# Patient Record
Sex: Female | Born: 1970 | Race: Black or African American | Hispanic: No | Marital: Single | Smoking: Never smoker
Health system: Southern US, Community
[De-identification: ages and names within clinical notes are randomized; demographics above are authoritative.]

## PROBLEM LIST (undated history)

## (undated) DIAGNOSIS — N879 Dysplasia of cervix uteri, unspecified: Secondary | ICD-10-CM

## (undated) DIAGNOSIS — R32 Unspecified urinary incontinence: Secondary | ICD-10-CM

## (undated) HISTORY — DX: Unspecified urinary incontinence: R32

## (undated) HISTORY — PX: FOOT SURGERY: SHX648

## (undated) HISTORY — DX: Dysplasia of cervix uteri, unspecified: N87.9

## (undated) HISTORY — PX: COLPOSCOPY: SHX161

## (undated) HISTORY — PX: TUBAL LIGATION: SHX77

## (undated) HISTORY — PX: LASER ABLATION OF THE CERVIX: SHX1949

---

## 2005-04-17 DIAGNOSIS — N879 Dysplasia of cervix uteri, unspecified: Secondary | ICD-10-CM

## 2005-04-17 HISTORY — DX: Dysplasia of cervix uteri, unspecified: N87.9

## 2009-01-15 ENCOUNTER — Emergency Department (HOSPITAL_COMMUNITY): Admission: EM | Admit: 2009-01-15 | Discharge: 2009-01-15 | Payer: Self-pay | Admitting: Emergency Medicine

## 2009-01-24 ENCOUNTER — Emergency Department (HOSPITAL_COMMUNITY): Admission: EM | Admit: 2009-01-24 | Discharge: 2009-01-24 | Payer: Self-pay | Admitting: Emergency Medicine

## 2010-10-12 ENCOUNTER — Inpatient Hospital Stay (INDEPENDENT_AMBULATORY_CARE_PROVIDER_SITE_OTHER)
Admission: RE | Admit: 2010-10-12 | Discharge: 2010-10-12 | Disposition: A | Discharge status: Home / Self Care | Payer: PRIVATE HEALTH INSURANCE | Source: Ambulatory Visit | Attending: Family Medicine | Admitting: Family Medicine

## 2010-10-12 DIAGNOSIS — J069 Acute upper respiratory infection, unspecified: Secondary | ICD-10-CM

## 2010-10-12 DIAGNOSIS — H6120 Impacted cerumen, unspecified ear: Secondary | ICD-10-CM

## 2010-11-15 ENCOUNTER — Ambulatory Visit (INDEPENDENT_AMBULATORY_CARE_PROVIDER_SITE_OTHER): Payer: PRIVATE HEALTH INSURANCE | Admitting: Gynecology

## 2010-11-15 ENCOUNTER — Encounter: Payer: Self-pay | Admitting: Gynecology

## 2010-11-15 ENCOUNTER — Other Ambulatory Visit (HOSPITAL_COMMUNITY)
Admission: RE | Admit: 2010-11-15 | Discharge: 2010-11-15 | Disposition: A | Discharge status: Home / Self Care | Payer: PRIVATE HEALTH INSURANCE | Source: Ambulatory Visit | Attending: Gynecology | Admitting: Gynecology

## 2010-11-15 VITALS — BP 110/70 | Ht 70.0 in | Wt 188.0 lb

## 2010-11-15 DIAGNOSIS — B3731 Acute candidiasis of vulva and vagina: Secondary | ICD-10-CM

## 2010-11-15 DIAGNOSIS — N920 Excessive and frequent menstruation with regular cycle: Secondary | ICD-10-CM

## 2010-11-15 DIAGNOSIS — Z8632 Personal history of gestational diabetes: Secondary | ICD-10-CM

## 2010-11-15 DIAGNOSIS — B373 Candidiasis of vulva and vagina: Secondary | ICD-10-CM

## 2010-11-15 DIAGNOSIS — Z01419 Encounter for gynecological examination (general) (routine) without abnormal findings: Secondary | ICD-10-CM

## 2010-11-15 DIAGNOSIS — N393 Stress incontinence (female) (male): Secondary | ICD-10-CM

## 2010-11-15 DIAGNOSIS — Z13220 Encounter for screening for lipoid disorders: Secondary | ICD-10-CM

## 2010-11-15 MED ORDER — FLUCONAZOLE 150 MG PO TABS: 150.0000 mg | ORAL_TABLET | Freq: Once | ORAL | Status: AC

## 2010-11-15 NOTE — Progress Notes (Signed)
Misty Bates 03-06-1971 [id number]   History:    40 y.o.  for annual exam.  Has not had an annual exam for 4 years. Patient notes periods are coming every 21 days fairly heavy although they're always been this way since the delivery of her last child 4 years ago. No bleeding in between her periods. She notes that to both nipples are dry and cracked and have always been this way even before having children she is seen multiple physicians and tried lots of different creams and it does not help her. There's never any bleeding or galactorrhea. She is also bothered by loss of urine with coughing and sneezing worse after the rest of her child. No leakage in between no UTI symptoms. 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  Past medical history,surgical history, family history and social history were all reviewed and documented in the EPIC chart. ROS:  Was performed and pertinent positives and negatives are included in the history.  Exam: chaperone present Filed Vitals:   11/15/10 1408  BP: 110/70   General appearance  Normal Skin grossly normal Head/Neck normal with no cervical or supraclavicular adenopathy thyroid normal Lungs  clear Cardiac RR, without RMG Abdominal  soft, nontender, without masses, guarding, rebound or organomegaly  Breasts  examined lying and sitting without masses, retractions, discharge or axillary adenopathy.  Both nipples are normal in appearance and without overt cracking nipple discharge bleeding or other abnormalities Pelvic  Ext/BUS/vagina  normal   Cervix  normal located deep left vaginal corner Pap done white discharge noted wet prep done  Uterus  anteverted, normal size, shape and contour, midline and mobile nontender   Adnexa  Without masses or tenderness  Anus and perineum  normal   Rectovaginal  normal sphincter tone without palpated masses or  tenderness.   Assessment/Plan:  40 y.o. female for annual exam . #1 Health maintenance self breast exams on a monthly basis discussed and urged had screening mammogram at age 48 recommended her to schedule one now she agrees to arrange.  Using tubal sterilization for contraception. Routine CBC urinalysis and lipid profile were ordered random glucose ordered do to history of gestational diabetes. #2 Menorrhagia patient's periods are every 21 days fairly heavy lasting 7 days have been this way for years. When check baseline TSH prolactin CBC in reference to this her exam is normal I offered sonohysterogram for baseline although again this is not a new finding. Options for management were reviewed to include this low dose oral contraceptives she does use snuff, short this is a good choice. Mirena IUD was also discussed although she did try this once before and had it removed because of side effects.  Endometrial ablation was also reviewed and I gave her a copy of her option pamphlet and information. She can read over this and decided she was to followup with [place]. If she doesn't we'll start with sonohysterogram. #3 Urinary incontinence. Patient has history suggestive of stress urinary incontinence. She loses urine with laughing sneezing coughing no real urgency symptoms and no spontaneous loss of. I discussed the options to include Kegel exercises we talked about these as well as considering a sling surgical procedure.  Patient is not interested surgery for now she can try the Kegel exercises and she is interested I will refer her to urology for evaluation. #4 patient has white discharge KOH wet  prep is positive for yeast we'll treat with Diflucan 150x1 dose follow up if symptoms persist or recur.    [name] MD, 3:33 PM 11/15/2010

## 2010-11-16 ENCOUNTER — Encounter: Payer: Self-pay | Admitting: Gynecology

## 2010-11-16 ENCOUNTER — Telehealth: Payer: Self-pay | Admitting: Gynecology

## 2010-11-16 NOTE — Telephone Encounter (Signed)
Wet prep positive for yeast

## 2011-11-21 ENCOUNTER — Encounter: Payer: PRIVATE HEALTH INSURANCE | Admitting: Gynecology

## 2011-11-29 ENCOUNTER — Telehealth: Payer: Self-pay | Admitting: *Deleted

## 2011-11-29 NOTE — Telephone Encounter (Signed)
Pt called c/o she couldn't find her tampon that was placed on Friday night. Pt said she went out party Friday night and doesn't remember taking her old one out before placing a new tampon. Pt has no fever, foul odor, pain. I explained to pt about toxic shock syndrome, offered OV to make sure tampon was out, pt declined stating "I will wait for a couple of day and call back if needed."

## 2011-11-30 ENCOUNTER — Encounter: Payer: Self-pay | Admitting: Gynecology

## 2011-11-30 ENCOUNTER — Ambulatory Visit (INDEPENDENT_AMBULATORY_CARE_PROVIDER_SITE_OTHER): Payer: PRIVATE HEALTH INSURANCE | Admitting: Gynecology

## 2011-11-30 ENCOUNTER — Encounter: Payer: PRIVATE HEALTH INSURANCE | Admitting: Gynecology

## 2011-11-30 ENCOUNTER — Other Ambulatory Visit (HOSPITAL_COMMUNITY)
Admission: RE | Admit: 2011-11-30 | Discharge: 2011-11-30 | Disposition: A | Discharge status: Home / Self Care | Payer: No Typology Code available for payment source | Source: Ambulatory Visit | Attending: Gynecology | Admitting: Gynecology

## 2011-11-30 VITALS — BP 120/70 | Ht 70.5 in | Wt 203.0 lb

## 2011-11-30 DIAGNOSIS — N920 Excessive and frequent menstruation with regular cycle: Secondary | ICD-10-CM

## 2011-11-30 DIAGNOSIS — Z01419 Encounter for gynecological examination (general) (routine) without abnormal findings: Secondary | ICD-10-CM

## 2011-11-30 DIAGNOSIS — Z131 Encounter for screening for diabetes mellitus: Secondary | ICD-10-CM

## 2011-11-30 DIAGNOSIS — Z1151 Encounter for screening for human papillomavirus (HPV): Secondary | ICD-10-CM | POA: Insufficient documentation

## 2011-11-30 DIAGNOSIS — Z13220 Encounter for screening for lipoid disorders: Secondary | ICD-10-CM

## 2011-11-30 DIAGNOSIS — N393 Stress incontinence (female) (male): Secondary | ICD-10-CM

## 2011-11-30 LAB — LIPID PANEL
Cholesterol: 194 mg/dL (ref 0–200)
LDL Cholesterol: 95 mg/dL (ref 0–99)
VLDL: 39 mg/dL (ref 0–40)

## 2011-11-30 LAB — CBC WITH DIFFERENTIAL/PLATELET
Basophils Absolute: 0 10*3/uL (ref 0.0–0.1)
Eosinophils Absolute: 0.1 10*3/uL (ref 0.0–0.7)
Lymphocytes Relative: 29 % (ref 12–46)
Lymphs Abs: 1.4 10*3/uL (ref 0.7–4.0)
MCH: 29.7 pg (ref 26.0–34.0)
MCHC: 34.8 g/dL (ref 30.0–36.0)
Monocytes Absolute: 0.5 10*3/uL (ref 0.1–1.0)
RDW: 14.8 % (ref 11.5–15.5)
WBC: 4.9 10*3/uL (ref 4.0–10.5)

## 2011-11-30 NOTE — Progress Notes (Signed)
Misty Bates February 27, 1971 [id number]        41 y.o.  [id number] for annual exam.  Several issues noted below.  Past medical history,surgical history, medications, allergies, family history and social history were all reviewed and documented in the EPIC chart. ROS:  Was performed and pertinent positives and negatives are included in the history.  Exam: Amy assistant Filed Vitals:   11/30/11 1500  BP: 120/70  Height: 5' 10.5" (1.791 m)  Weight: 203 lb (92.08 kg)   General appearance  Normal Skin grossly normal Head/Neck normal with no cervical or supraclavicular adenopathy thyroid normal Lungs  clear Cardiac RR, without RMG Abdominal  soft, nontender, without masses, organomegaly or hernia Breasts  examined lying and sitting without masses, retractions, discharge or axillary adenopathy. Pelvic  Ext/BUS/vagina  normal   Cervix  normal posterior right deep vaginal vault. Pap/HPV  Uterus  anteverted, normal size, shape and contour, midline and mobile nontender   Adnexa  Without masses or tenderness    Anus and perineum  normal   Rectovaginal  normal sphincter tone without palpated masses or tenderness.    Assessment/Plan:  41 y.o. [name] female for annual exam, status post BTL.   1. Menorrhagia. Patient's menses continue heavy. We discussed this last year. Her TSH was normal CBC shows mild anemia. Recommend start with sonohysterogram this year rule out nonpalpable abnormalities. I again reviewed options to include hormonal manipulation, Mirena IUD, endometrial ablation, hysterectomy. I can give her literature on the [employer] endometrial ablation. She'll follow for the sonohysterogram and then we'll go from there. 2. Urinary incontinence. I again discussed her classic SUI symptoms as per last year. I reviewed options to include Kegel exercise, observation, surgical repair such as sling.  Offered referral to urology and she declined and would prefer to observe at present. 3. Mammogram.  Patient has not had mammogram. I strongly urged her to do so and discussed the benefits of early detection. She agrees to schedule this I gave her names of facilities in [place]. SBE monthly reviewed. 4. Pap smear.  Pap smear last year was normal. I repeated Pap/HPV. 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 She reports her Pap smears have been normal since then. 5. Health maintenance. Baseline CBC lipid profile glucose urinalysis ordered. Patient will follow up for her ultrasound and we'll go from there.    Misty Bates, 4:11 PM 11/30/2011

## 2011-11-30 NOTE — Patient Instructions (Signed)
Follow up for ultrasound as scheduled 

## 2011-12-01 LAB — URINALYSIS W MICROSCOPIC + REFLEX CULTURE
Bacteria, UA: NONE SEEN
Crystals: NONE SEEN
Glucose, UA: NEGATIVE mg/dL
Hgb urine dipstick: NEGATIVE
Ketones, ur: NEGATIVE mg/dL
Leukocytes, UA: NEGATIVE
Protein, ur: NEGATIVE mg/dL
pH: 7 (ref 5.0–8.0)

## 2011-12-07 ENCOUNTER — Encounter: Payer: PRIVATE HEALTH INSURANCE | Admitting: Gynecology

## 2011-12-11 ENCOUNTER — Emergency Department (INDEPENDENT_AMBULATORY_CARE_PROVIDER_SITE_OTHER): Payer: PRIVATE HEALTH INSURANCE

## 2011-12-11 ENCOUNTER — Emergency Department (INDEPENDENT_AMBULATORY_CARE_PROVIDER_SITE_OTHER)
Admission: EM | Admit: 2011-12-11 | Discharge: 2011-12-11 | Disposition: A | Discharge status: Home / Self Care | Payer: PRIVATE HEALTH INSURANCE | Source: Home / Self Care | Attending: Emergency Medicine | Admitting: Emergency Medicine

## 2011-12-11 ENCOUNTER — Emergency Department (HOSPITAL_COMMUNITY)
Admission: EM | Admit: 2011-12-11 | Discharge: 2011-12-11 | Disposition: A | Discharge status: Home / Self Care | Payer: PRIVATE HEALTH INSURANCE | Source: Home / Self Care | Attending: Emergency Medicine | Admitting: Emergency Medicine

## 2011-12-11 ENCOUNTER — Encounter (HOSPITAL_COMMUNITY): Payer: Self-pay | Admitting: Emergency Medicine

## 2011-12-11 DIAGNOSIS — R109 Unspecified abdominal pain: Secondary | ICD-10-CM

## 2011-12-11 LAB — CBC WITH DIFFERENTIAL/PLATELET
Basophils Absolute: 0 10*3/uL (ref 0.0–0.1)
Eosinophils Absolute: 0.1 10*3/uL (ref 0.0–0.7)
Eosinophils Relative: 1 % (ref 0–5)
HCT: 38 % (ref 36.0–46.0)
Hemoglobin: 13.1 g/dL (ref 12.0–15.0)
Monocytes Absolute: 0.3 10*3/uL (ref 0.1–1.0)
Monocytes Relative: 6 % (ref 3–12)
Neutro Abs: 3.8 10*3/uL (ref 1.7–7.7)
Platelets: 271 10*3/uL (ref 150–400)
Smear Review: ADEQUATE

## 2011-12-11 LAB — POCT PREGNANCY, URINE: Preg Test, Ur: NEGATIVE

## 2011-12-11 LAB — POCT URINALYSIS DIP (DEVICE)
Bilirubin Urine: NEGATIVE
Glucose, UA: NEGATIVE mg/dL
Specific Gravity, Urine: 1.01 (ref 1.005–1.030)
Urobilinogen, UA: 0.2 mg/dL (ref 0.0–1.0)
pH: 6 (ref 5.0–8.0)

## 2011-12-11 MED ORDER — POLYETHYLENE GLYCOL 3350 17 GM/SCOOP PO POWD: 17.0000 g | Freq: Every day | ORAL | Status: AC

## 2011-12-11 MED ORDER — MELOXICAM 15 MG PO TABS: 15.0000 mg | ORAL_TABLET | Freq: Every day | ORAL | Status: AC

## 2011-12-11 NOTE — ED Notes (Signed)
State she has been having pain in her left flank area for past week

## 2011-12-11 NOTE — ED Provider Notes (Signed)
Chief Complaint  Patient presents with  . Flank Pain    History of Present Illness:    The patient is a 41 year old female who has had a one-week history of intermittent pain in the right and left flank. The pain seems to alternate between one flank any other. Since this morning she's had pain and left flank area which feels like a spasm or a pinching pain. It does not radiate. The pain is nonpleuritic. It hurts to touch, with movement, or T10 is better with Advil. She's felt nauseated and hasn't had much of an appetite. She has had no vomiting. Generally she's been constipated although today she has had some loose stools without blood or mucus. She notes slight dysuria and frequency, but no urgency or hematuria. Last menstrual period was August 8. She is sexually active. She denies any vaginal discharge or itching.  Review of Systems:  Other than noted above, the patient denies any of the following symptoms: Constitutional:  No fever, chills, fatigue, weight loss or anorexia. Lungs:  No cough or shortness of breath. Heart:  No chest pain, palpitations, syncope or edema.  No cardiac history. Abdomen:  No nausea, vomiting, hematememesis, melena, diarrhea, or hematochezia. GU:  No dysuria, frequency, urgency, or hematuria. Gyn:  No vaginal discharge, itching, abnormal bleeding, dyspareunia, or pelvic pain. Skin:  No rash or itching.  PMFSH:  Past medical history, family history, social history, meds, and allergies were reviewed along with nurse's notes.  No prior abdominal surgeries, past history of GI problems, STDs or GYN problems.  No history of aspirin or NSAID use.  No excessive alcohol intake.  Physical Exam:   Vital signs:  BP 115/75  Pulse 76  Temp 97.9 F (36.6 C) (Oral)  Resp 18  SpO2 98%  LMP 11/23/2011 Gen:  Alert, oriented, in no distress. Lungs:  Breath sounds clear and equal bilaterally.  No wheezes, rales or rhonchi. Heart:  Regular rhythm.  No gallops or murmers.     Abdomen:  Abdomen was soft and flat. There was mild tenderness to palpation in the left flank area. No guarding or rebound. No organomegaly or mass. Bowel sounds are normally active. Skin:  Clear, warm and dry.  No rash.  Labs:   Results for orders placed during the hospital encounter of 12/11/11  CBC WITH DIFFERENTIAL      Component Value Range   WBC 5.8  4.0 - 10.5 K/uL   RBC 4.37  3.87 - 5.11 MIL/uL   Hemoglobin 13.1  12.0 - 15.0 g/dL   HCT 16.1  09.6 - 04.5 %   MCV 87.0  78.0 - 100.0 fL   MCH 30.0  26.0 - 34.0 pg   MCHC 34.5  30.0 - 36.0 g/dL   RDW 40.9  81.1 - 91.4 %   Platelets 271  150 - 400 K/uL   Neutrophils Relative 65  43 - 77 %   Lymphocytes Relative 28  12 - 46 %   Monocytes Relative 6  3 - 12 %   Eosinophils Relative 1  0 - 5 %   Basophils Relative 0  0 - 1 %   Neutro Abs 3.8  1.7 - 7.7 K/uL   Lymphs Abs 1.6  0.7 - 4.0 K/uL   Monocytes Absolute 0.3  0.1 - 1.0 K/uL   Eosinophils Absolute 0.1  0.0 - 0.7 K/uL   Basophils Absolute 0.0  0.0 - 0.1 K/uL   Smear Review       Value:  PLATELET CLUMPS NOTED ON SMEAR, COUNT APPEARS ADEQUATE  POCT PREGNANCY, URINE      Component Value Range   Preg Test, Ur NEGATIVE  NEGATIVE  POCT URINALYSIS DIP (DEVICE)      Component Value Range   Glucose, UA NEGATIVE  NEGATIVE mg/dL   Bilirubin Urine NEGATIVE  NEGATIVE   Ketones, ur NEGATIVE  NEGATIVE mg/dL   Specific Gravity, Urine 1.010  1.005 - 1.030   Hgb urine dipstick TRACE (*) NEGATIVE   pH 6.0  5.0 - 8.0   Protein, ur NEGATIVE  NEGATIVE mg/dL   Urobilinogen, UA 0.2  0.0 - 1.0 mg/dL   Nitrite NEGATIVE  NEGATIVE   Leukocytes, UA NEGATIVE  NEGATIVE     Radiology:  Dg Abd Acute W/chest  12/11/2011  *RADIOLOGY REPORT*  Clinical Data: 41 year old female left flank pain.  ACUTE ABDOMEN SERIES (ABDOMEN 2 VIEW & CHEST 1 VIEW)  Comparison: None.  Findings: Low lung volumes.  Mild crowding of markings at the lung bases.  Otherwise the lungs are clear.  No pneumothorax or  pneumoperitoneum.  Cardiac size and mediastinal contours are within normal limits.  Visualized tracheal air column is within normal limits.  Nonobstructed bowel gas pattern.  Pelvic phleboliths.  Abdominal and pelvic visceral contours are within normal limits.  No acute osseous abnormality identified.  IMPRESSION: 1. Nonobstructed bowel gas pattern, no free air. 2.  Low lung volumes.   Original Report Authenticated By: Harley Hallmark, M.D.     Assessment:  The encounter diagnosis was Left flank pain. This could be musculoskeletal or due to constipation. She did have a large amount of stool in her colon. There is no evidence for diverticulitis.  Plan:   1.  The following meds were prescribed:   New Prescriptions   MELOXICAM (MOBIC) 15 MG TABLET    Take 1 tablet (15 mg total) by mouth daily.   POLYETHYLENE GLYCOL POWDER (MIRALAX) POWDER    Take 17 g by mouth daily.   2.  The patient was instructed in symptomatic care and handouts were given. 3.  The patient was told to return if becoming worse in any way, if no better in 3 or 4 days, and given some red flag symptoms that would indicate earlier return. She was told to return if she should have any other symptoms such as fever, chills, nausea, vomiting, or blood in the stool.    Reuben Likes, MD 12/11/11 [phone]

## 2011-12-13 ENCOUNTER — Other Ambulatory Visit: Payer: Self-pay | Admitting: Gynecology

## 2011-12-13 DIAGNOSIS — N920 Excessive and frequent menstruation with regular cycle: Secondary | ICD-10-CM

## 2011-12-14 ENCOUNTER — Ambulatory Visit: Payer: PRIVATE HEALTH INSURANCE | Admitting: Gynecology

## 2011-12-14 ENCOUNTER — Other Ambulatory Visit: Payer: PRIVATE HEALTH INSURANCE

## 2012-02-28 ENCOUNTER — Ambulatory Visit (INDEPENDENT_AMBULATORY_CARE_PROVIDER_SITE_OTHER): Payer: PRIVATE HEALTH INSURANCE | Admitting: Gynecology

## 2012-02-28 ENCOUNTER — Encounter: Payer: Self-pay | Admitting: Gynecology

## 2012-02-28 DIAGNOSIS — L293 Anogenital pruritus, unspecified: Secondary | ICD-10-CM

## 2012-02-28 DIAGNOSIS — M545 Low back pain, unspecified: Secondary | ICD-10-CM

## 2012-02-28 DIAGNOSIS — N949 Unspecified condition associated with female genital organs and menstrual cycle: Secondary | ICD-10-CM

## 2012-02-28 DIAGNOSIS — R102 Pelvic and perineal pain: Secondary | ICD-10-CM

## 2012-02-28 DIAGNOSIS — N898 Other specified noninflammatory disorders of vagina: Secondary | ICD-10-CM

## 2012-02-28 DIAGNOSIS — N920 Excessive and frequent menstruation with regular cycle: Secondary | ICD-10-CM

## 2012-02-28 LAB — WET PREP FOR TRICH, YEAST, CLUE
Trich, Wet Prep: NONE SEEN
Yeast Wet Prep HPF POC: NONE SEEN

## 2012-02-28 LAB — URINALYSIS W MICROSCOPIC + REFLEX CULTURE
Leukocytes, UA: NEGATIVE
Protein, ur: NEGATIVE mg/dL
pH: 5.5 (ref 5.0–8.0)

## 2012-02-28 MED ORDER — METRONIDAZOLE 500 MG PO TABS: 500.0000 mg | ORAL_TABLET | Freq: Two times a day (BID) | ORAL | Status: DC

## 2012-02-28 NOTE — Patient Instructions (Signed)
Take Flagyl antibiotic twice daily for 7 days. No alcohol while taking the medication. Follow up for the ultrasound for your heavy periods as you scheduled.

## 2012-02-28 NOTE — Progress Notes (Signed)
Patient presents with several issues. 1. Vaginal discharge with some itching over the last week or so. No odor. 2. Dark urine with odor.  Some pelvic pressure with low back pain. No frequency dysuria urgency fever chills nausea vomiting. Does have constipation chronically with last bowel movement yesterday. No diarrhea. 3. Menorrhagia. Menses continue heavy as they have over the past several years. Flows 7 days double protection occasional breakthrough episodes. Have been asked to schedule ultrasound and never did so. Had normal CBC and thyroid.  Exam with [facility] [place] assistant Spine straight no CVA tenderness Abdomen soft nontender without masses guarding rebound organomegaly. Pelvic external BUS vagina with white discharge. Cervix normal. Uterus anteverted grossly normal in size midline mobile nontender. Adnexa without masses or tenderness.  Assessment and plan: 1. Vaginal discharge. Wet prep consistent with extra vaginosis. Treat with Flagyl 500 mg twice a day x7 days, alcohol voidance reviewed. Follow up if symptoms persist or recur. 2. Dark urine/pelvic discomfort. UA is negative. Exam is normal.  Start with ultrasound to rule out nonpalpable abnormalities such as ovarian cysts. Suspect discomfort may be related to her constipation. Recommended increased fiber, fluids to increase stooling. 3. Menorrhagia. I again recommended sonohysterogram rule out nonpalpable abnormality such as leiomyoma, submucous components, polyps.  Options for bleeding to include hormonal manipulation, endometrial ablation, Mirena IUD, hysterectomy discussed. She apparently had a Mirena IUD at another office previously but bled through this and had it removed. Patient agrees to schedule the ultrasound and will follow up for this.

## 2012-03-20 ENCOUNTER — Ambulatory Visit: Payer: PRIVATE HEALTH INSURANCE | Admitting: Gynecology

## 2012-03-20 ENCOUNTER — Other Ambulatory Visit: Payer: PRIVATE HEALTH INSURANCE

## 2013-04-04 ENCOUNTER — Ambulatory Visit (INDEPENDENT_AMBULATORY_CARE_PROVIDER_SITE_OTHER): Payer: 59 | Admitting: Gynecology

## 2013-04-04 ENCOUNTER — Encounter: Payer: Self-pay | Admitting: Gynecology

## 2013-04-04 ENCOUNTER — Other Ambulatory Visit: Payer: Self-pay | Admitting: Gynecology

## 2013-04-04 ENCOUNTER — Telehealth: Payer: Self-pay | Admitting: *Deleted

## 2013-04-04 DIAGNOSIS — N644 Mastodynia: Secondary | ICD-10-CM

## 2013-04-04 DIAGNOSIS — Z0000 Encounter for general adult medical examination without abnormal findings: Secondary | ICD-10-CM

## 2013-04-04 NOTE — Telephone Encounter (Signed)
Order placed for the below, breast center will contact pt to schedule.

## 2013-04-04 NOTE — Telephone Encounter (Signed)
Message copied by Aura Camps on Fri Apr 04, 2013  3:51 PM ------      Message from: [name]      Created: Fri Apr 04, 2013  3:11 PM       Schedule diagnostic mammography and ultrasound at radiologist discretion reference left breast pain no findings on physical exam. ------

## 2013-04-04 NOTE — Progress Notes (Signed)
Patient presents with several day history of left breast mastalgia. There is a diffuse tenderness in her left breast. No nipple discharge or masses on self breast exam. No history of same before. No symptoms in the right breast.  Exam was Misty Bates Both breasts examined lying and sitting without masses retractions discharge adenopathy.   Assessment and plan: New onset left breast pain with negative exam. No recent mammography. Recommend diagnostic mammogram possible ultrasound at radiology discretion. Recommend heat over the area. Will also check baseline prolactin. If mammogram/ultrasound negative and symptoms resolved and will follow. Otherwise patient will followup for further evaluation.

## 2013-04-04 NOTE — Patient Instructions (Signed)
Followup for mammogram and possible ultrasound as arranged by office.

## 2013-04-21 NOTE — Telephone Encounter (Signed)
Spoke with Casey at breast center regarding scheduling the below, they will be contacting patient to schedule.  

## 2013-04-22 NOTE — Telephone Encounter (Signed)
Appt. 04/28/13 @ 3:30 pm

## 2013-04-28 ENCOUNTER — Ambulatory Visit
Admission: RE | Admit: 2013-04-28 | Discharge: 2013-04-28 | Disposition: A | Discharge status: Home / Self Care | Payer: 59 | Source: Ambulatory Visit | Attending: Gynecology | Admitting: Gynecology

## 2013-04-28 DIAGNOSIS — N644 Mastodynia: Secondary | ICD-10-CM

## 2013-05-15 ENCOUNTER — Ambulatory Visit (INDEPENDENT_AMBULATORY_CARE_PROVIDER_SITE_OTHER): Payer: 59 | Admitting: Gynecology

## 2013-05-15 ENCOUNTER — Encounter: Payer: Self-pay | Admitting: Gynecology

## 2013-05-15 VITALS — BP 124/78 | Ht 70.0 in | Wt 208.0 lb

## 2013-05-15 DIAGNOSIS — N920 Excessive and frequent menstruation with regular cycle: Secondary | ICD-10-CM

## 2013-05-15 DIAGNOSIS — Z01419 Encounter for gynecological examination (general) (routine) without abnormal findings: Secondary | ICD-10-CM

## 2013-05-15 DIAGNOSIS — N393 Stress incontinence (female) (male): Secondary | ICD-10-CM

## 2013-05-15 NOTE — Progress Notes (Signed)
Misty DittoShannon Bates 05-31-1970 [id number]        43 y.o.  [id number] for annual exam.  Several issues noted below.  Past medical history,surgical history, problem list, medications, allergies, family history and social history were all reviewed and documented in the EPIC chart.  ROS:  Performed and pertinent positives and negatives are included in the history, assessment and plan .  Exam: Kim assistant Filed Vitals:   05/15/13 1508  BP: 124/78  Height: 5\' 10"  (1.778 m)  Weight: 208 lb (94.348 kg)   General appearance  Normal Skin grossly normal Head/Neck normal with no cervical or supraclavicular adenopathy thyroid normal Lungs  clear Cardiac RR, without RMG Abdominal  soft, nontender, without masses, organomegaly or hernia Breasts  examined lying and sitting without masses, retractions, discharge or axillary adenopathy. Pelvic  Ext/BUS/vagina  Normal  Cervix  Normal high in the vaginal vault to the left of the patient  Uterus  axial to anteverted, normal size, shape and contour, midline and mobile nontender   Adnexa  Without masses or tenderness    Anus and perineum  Normal   Rectovaginal  Normal sphincter tone without palpated masses or tenderness.    Assessment/Plan:  43 y.o. [id number] female for annual exam heavy regular menses, tubal sterilization.   1. Menorrhagia. Patient's menses remain heavy. Regular monthly with no intermenstrual bleeding. I reviewed several times last year situation and options per 02/28/2012 and 11/30/2011 Notes. Had recommended sonohysterogram which she never followed up for. I again reviewed possibilities to include physiologic/adenomyosis/small myomas/endometrial polyps. I again recommended sonohysterogram. Patient at this point declined and said that she will watch them for now. Discussed checking TSH CBC. Patient states that she thought she had the blood drawn previously when she had her prolactin. I see future orders for her lab work of note lab results.  Patient's adamant about having had it done and declined any blood work at this time. We'll go ahead and check to make sure that from the records it does not appear that this was drawn. 2. Mastalgia. Recent evaluation for mastalgia showed negative diagnostic mammography and a normal exam. Patient notes that her discomfort is getting better. She'll continue with SBE monthly and annual mammography is as long as her pain resolves and we'll follow. If she has any palpable abnormalities or focal persistent pain she'll represent for evaluation. 3. Pap smear/HPV normal 11/2011. No Pap smear done today. 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 Plan repeat Pap smear at 3-5 year interval. 4. Urinary incontinence, stress by history with laughing coughing sneezing. I have discussed this on multiple occasions to include behavior modification/Kegel exercises. Recommended urologic evaluation to consider surgery or physical therapy. Patient agrees to followup with this will help her make this arrangement. Check urinalysis today. 5. Health maintenance. I again encouraged her to have CBC comprehensive metabolic panel lipid profile TSH drawn but she declined stating she thought she had trauma to her prolactin which again I do not think so but will check to make sure for her. If not and she knows my recommendation to have this drawn.   Note: This document was prepared with digital dictation and possible smart phrase technology. Any transcriptional errors that result from this process are unintentional.   Dara LordsFONTAINE,[name] MD, 3:33 PM 05/15/2013

## 2013-05-15 NOTE — Patient Instructions (Signed)
Office will contact you to arrange a urology appointment.

## 2013-05-16 ENCOUNTER — Telehealth: Payer: Self-pay | Admitting: *Deleted

## 2013-05-16 LAB — URINALYSIS W MICROSCOPIC + REFLEX CULTURE
Bacteria, UA: NONE SEEN
Bilirubin Urine: NEGATIVE
Casts: NONE SEEN
Crystals: NONE SEEN
Glucose, UA: NEGATIVE mg/dL
HGB URINE DIPSTICK: NEGATIVE
Ketones, ur: NEGATIVE mg/dL
NITRITE: NEGATIVE
Protein, ur: NEGATIVE mg/dL
Specific Gravity, Urine: 1.008 (ref 1.005–1.030)
Urobilinogen, UA: 0.2 mg/dL (ref 0.0–1.0)
pH: 7 (ref 5.0–8.0)

## 2013-05-16 NOTE — Telephone Encounter (Signed)
Appt. 05/26/13 @ 8:45 am With Dr.MacDiarmid pt informed, pt will come back to have blood done. Pt declined to have ultrasound.

## 2013-05-16 NOTE — Telephone Encounter (Signed)
Message copied by Aura CampsWEBB, JENNIFER L on Fri May 16, 2013 10:26 AM ------      Message from: Dara LordsFONTAINE, TIMOTHY P      Created: Thu May 15, 2013  3:59 PM       #1 patient needs appointment with urology reference stress urinary incontinence symptoms      #2 patient needs CBC comprehensive metabolic panel lipid profile TSH done. She swears she had it done when she had her prolactin in December. I see future order for all these but no results. If you could check with the lab just to verify they were not done until the patient and recommended she has these drawn      #3 patient and I discussed possible sonohysterogram but she declined at that time. Ask her if she wants to schedule the ultrasound reference her heavy bleeding ------

## 2013-05-17 LAB — URINE CULTURE

## 2013-07-04 ENCOUNTER — Emergency Department (INDEPENDENT_AMBULATORY_CARE_PROVIDER_SITE_OTHER)
Admission: EM | Admit: 2013-07-04 | Discharge: 2013-07-04 | Disposition: A | Discharge status: Home / Self Care | Payer: 59 | Source: Home / Self Care | Attending: Family Medicine | Admitting: Family Medicine

## 2013-07-04 ENCOUNTER — Encounter (HOSPITAL_COMMUNITY): Payer: Self-pay | Admitting: Emergency Medicine

## 2013-07-04 DIAGNOSIS — J069 Acute upper respiratory infection, unspecified: Secondary | ICD-10-CM

## 2013-07-04 MED ORDER — HYDROCOD POLST-CHLORPHEN POLST 10-8 MG/5ML PO LQCR: 5.0000 mL | Freq: Two times a day (BID) | ORAL | Status: DC | PRN

## 2013-07-04 MED ORDER — IPRATROPIUM BROMIDE 0.06 % NA SOLN: 2.0000 | Freq: Four times a day (QID) | NASAL | Status: DC

## 2013-07-04 MED ORDER — MINOCYCLINE HCL 100 MG PO CAPS: 100.0000 mg | ORAL_CAPSULE | Freq: Two times a day (BID) | ORAL | Status: DC

## 2013-07-04 NOTE — Discharge Instructions (Signed)
Drink plenty of fluids as discussed, use medicine as prescribed, and mucinex or delsym for cough. Return or see your doctor if further problems °

## 2013-07-04 NOTE — ED Provider Notes (Signed)
CSN: [id number]     Arrival date & time 07/04/13  0802 History   First MD Initiated Contact with Patient 07/04/13 0825     Chief Complaint  Patient presents with  . Sore Throat   (Consider location/radiation/quality/duration/timing/severity/associated sxs/prior Treatment) Patient is a 43 y.o. female presenting with pharyngitis. The history is provided by the patient.  Sore Throat This is a new problem. The current episode started more than 1 week ago (2 weeks of sx, laryngitis x 2 d.). The problem has been gradually worsening. Pertinent negatives include no chest pain, no abdominal pain, no headaches and no shortness of breath.    Past Medical History  Diagnosis Date  . Cervical dysplasia 2007    laser  . Urinary incontinence     Stress incontinence   Past Surgical History  Procedure Laterality Date  . Tubal ligation    . Laser ablation of the cervix    . Colposcopy    . Foot surgery     Family History  Problem Relation Age of Onset  . Diabetes Maternal Aunt   . Hypertension Maternal Aunt   . Diabetes Paternal Grandmother   . Hypertension Paternal Grandmother    History  Substance Use Topics  . Smoking status: Never Smoker   . Smokeless tobacco: Current User    Types: Snuff     Comment: trying to quit  . Alcohol Use: Yes     Comment: occassionally   OB History   Grav Para Term Preterm Abortions TAB SAB Ect Mult Living   5 4 4  1  1   4      Review of Systems  Constitutional: Negative.   HENT: Positive for congestion, postnasal drip, rhinorrhea and voice change.   Respiratory: Negative for shortness of breath and wheezing.   Cardiovascular: Negative for chest pain.  Gastrointestinal: Negative for abdominal pain.  Neurological: Negative for headaches.    Allergies  Review of patient's allergies indicates no known allergies.  Home Medications   Current Outpatient Rx  Name  Route  Sig  Dispense  Refill  . chlorpheniramine-HYDROcodone (TUSSIONEX PENNKINETIC  ER) 10-8 MG/5ML LQCR   Oral   Take 5 mLs by mouth every 12 (twelve) hours as needed for cough.   115 mL   0   . ipratropium (ATROVENT) 0.06 % nasal spray   Each Nare   Place 2 sprays into both nostrils 4 (four) times daily.   15 mL   1   . minocycline (MINOCIN,DYNACIN) 100 MG capsule   Oral   Take 1 capsule (100 mg total) by mouth 2 (two) times daily.   14 capsule   0    BP 117/75  Pulse 72  Temp(Src) 98.6 F (37 C) (Oral)  Resp 14  SpO2 100%  LMP 06/27/2013 Physical Exam  Nursing note and vitals reviewed. Constitutional: She is oriented to person, place, and time. She appears well-developed and well-nourished.  HENT:  Head: Normocephalic.  Right Ear: External ear normal.  Left Ear: External ear normal.  Nose: Mucosal edema and rhinorrhea present.  Mouth/Throat: Oropharynx is clear and moist.  Eyes: Conjunctivae are normal. Pupils are equal, round, and reactive to light.  Neck: Normal range of motion. Neck supple.  Cardiovascular: Normal rate and normal heart sounds.   Pulmonary/Chest: Effort normal and breath sounds normal.  Lymphadenopathy:    She has no cervical adenopathy.  Neurological: She is alert and oriented to person, place, and time.  Skin: Skin is warm and dry.  ED Course  Procedures (including critical care time) Labs Review Labs Reviewed - No data to display Imaging Review No results found.   MDM   1. URI (upper respiratory infection)        Linna HoffJames D Vinal Rosengrant, MD 07/04/13 [phone]

## 2013-07-04 NOTE — ED Notes (Signed)
Pt  Reports  Symptoms  Of  sorethroat           Earache    Bilateral     As  Well  As  Sinus  Drainage           With  Symptoms  X  2  Weeks                 symptoms     Not  releived  By  otc  meds         She reports    Hoarseness        As  Well

## 2013-11-12 ENCOUNTER — Encounter: Payer: Self-pay | Admitting: Gynecology

## 2013-11-12 ENCOUNTER — Ambulatory Visit (INDEPENDENT_AMBULATORY_CARE_PROVIDER_SITE_OTHER): Payer: 59 | Admitting: Gynecology

## 2013-11-12 DIAGNOSIS — B373 Candidiasis of vulva and vagina: Secondary | ICD-10-CM

## 2013-11-12 DIAGNOSIS — N898 Other specified noninflammatory disorders of vagina: Secondary | ICD-10-CM

## 2013-11-12 DIAGNOSIS — B3731 Acute candidiasis of vulva and vagina: Secondary | ICD-10-CM

## 2013-11-12 LAB — WET PREP FOR TRICH, YEAST, CLUE
Clue Cells Wet Prep HPF POC: NONE SEEN
TRICH WET PREP: NONE SEEN

## 2013-11-12 MED ORDER — FLUCONAZOLE 150 MG PO TABS: 150.0000 mg | ORAL_TABLET | Freq: Once | ORAL | Status: DC

## 2013-11-12 NOTE — Patient Instructions (Signed)
Take Diflucan pill. Followup if your symptoms persist, worsen or recur.  Monilial Vaginitis Vaginitis in a soreness, swelling and redness (inflammation) of the vagina and vulva. Monilial vaginitis is not a sexually transmitted infection. CAUSES  Yeast vaginitis is caused by yeast (candida) that is normally found in your vagina. With a yeast infection, the candida has overgrown in number to a point that upsets the chemical balance. SYMPTOMS   White, thick vaginal discharge.  Swelling, itching, redness and irritation of the vagina and possibly the lips of the vagina (vulva).  Burning or painful urination.  Painful intercourse. DIAGNOSIS  Things that may contribute to monilial vaginitis are:  Postmenopausal and virginal states.  Pregnancy.  Infections.  Being tired, sick or stressed, especially if you had monilial vaginitis in the past.  Diabetes. Good control will help lower the chance.  Birth control pills.  Tight fitting garments.  Using bubble bath, feminine sprays, douches or deodorant tampons.  Taking certain medications that kill germs (antibiotics).  Sporadic recurrence can occur if you become ill. TREATMENT  Your caregiver will give you medication.  There are several kinds of anti monilial vaginal creams and suppositories specific for monilial vaginitis. For recurrent yeast infections, use a suppository or cream in the vagina 2 times a week, or as directed.  Anti-monilial or steroid cream for the itching or irritation of the vulva may also be used. Get your caregiver's permission.  Painting the vagina with methylene blue solution may help if the monilial cream does not work.  Eating yogurt may help prevent monilial vaginitis. HOME CARE INSTRUCTIONS   Finish all medication as prescribed.  Do not have sex until treatment is completed or after your caregiver tells you it is okay.  Take warm sitz baths.  Do not douche.  Do not use tampons, especially scented  ones.  Wear cotton underwear.  Avoid tight pants and panty hose.  Tell your sexual partner that you have a yeast infection. They should go to their caregiver if they have symptoms such as mild rash or itching.  Your sexual partner should be treated as well if your infection is difficult to eliminate.  Practice safer sex. Use condoms.  Some vaginal medications cause latex condoms to fail. Vaginal medications that harm condoms are:  Cleocin cream.  Butoconazole (Femstat).  Terconazole (Terazol) vaginal suppository.  Miconazole (Monistat) (may be purchased over the counter). SEEK MEDICAL CARE IF:   You have a temperature by mouth above 102 F (38.9 C).  The infection is getting worse after 2 days of treatment.  The infection is not getting better after 3 days of treatment.  You develop blisters in or around your vagina.  You develop vaginal bleeding, and it is not your menstrual period.  You have pain when you urinate.  You develop intestinal problems.  You have pain with sexual intercourse. Document Released: 01/11/2005 Document Revised: 06/26/2011 Document Reviewed: 09/25/2008 [facility] Patient Information 2015 [employer], [place]. This information is not intended to replace advice given to you by your health care provider. Make sure you discuss any questions you have with your health care provider.

## 2013-11-12 NOTE — Progress Notes (Signed)
Minda DittoShannon Hardigree 1970-08-27 [id number]        43 y.o.  [id number] presents complaining of vaginal discharge x1 day. Slight irritation. No real odor. No urinary symptoms such as frequency dysuria or urgency.  Past medical history,surgical history, problem list, medications, allergies, family history and social history were all reviewed and documented in the EPIC chart.  Directed ROS with pertinent positives and negatives documented in the history of present illness/assessment and plan.  Exam: Kim assistant General appearance:  Normal Pelvic external BUS vagina with white discharge. Cervix high in the vaginal vault. Uterus normal size midline mobile nontender. Adnexa without masses or tenderness  Assessment/Plan:  43 y.o. [id number] with exam, symptoms and wet prep consistent with yeast vaginitis. Will treat with Diflucan 150 mg x1 dose. Followup if symptoms persist, worsen or recur.   Note: This document was prepared with digital dictation and possible smart phrase technology. Any transcriptional errors that result from this process are unintentional.   Dara LordsFONTAINE,Estrellita Lasky P MD, 2:52 PM 11/12/2013

## 2014-02-16 ENCOUNTER — Encounter: Payer: Self-pay | Admitting: Gynecology

## 2014-02-26 ENCOUNTER — Encounter (HOSPITAL_COMMUNITY): Payer: Self-pay

## 2014-02-26 ENCOUNTER — Emergency Department (HOSPITAL_COMMUNITY)
Admission: EM | Admit: 2014-02-26 | Discharge: 2014-02-26 | Disposition: A | Discharge status: Home / Self Care | Payer: 59 | Attending: Emergency Medicine | Admitting: Emergency Medicine

## 2014-02-26 DIAGNOSIS — Z8742 Personal history of other diseases of the female genital tract: Secondary | ICD-10-CM | POA: Diagnosis not present

## 2014-02-26 DIAGNOSIS — S61411A Laceration without foreign body of right hand, initial encounter: Secondary | ICD-10-CM | POA: Diagnosis present

## 2014-02-26 DIAGNOSIS — Y998 Other external cause status: Secondary | ICD-10-CM | POA: Insufficient documentation

## 2014-02-26 DIAGNOSIS — Y929 Unspecified place or not applicable: Secondary | ICD-10-CM | POA: Insufficient documentation

## 2014-02-26 DIAGNOSIS — Z23 Encounter for immunization: Secondary | ICD-10-CM | POA: Insufficient documentation

## 2014-02-26 DIAGNOSIS — W260XXA Contact with knife, initial encounter: Secondary | ICD-10-CM | POA: Insufficient documentation

## 2014-02-26 DIAGNOSIS — Y9389 Activity, other specified: Secondary | ICD-10-CM | POA: Insufficient documentation

## 2014-02-26 DIAGNOSIS — Z79899 Other long term (current) drug therapy: Secondary | ICD-10-CM | POA: Diagnosis not present

## 2014-02-26 MED ORDER — TETANUS-DIPHTH-ACELL PERTUSSIS 5-2.5-18.5 LF-MCG/0.5 IM SUSP
0.5000 mL | Freq: Once | INTRAMUSCULAR | Status: AC
  Administered 2014-02-26: 0.5 mL via INTRAMUSCULAR
  Filled 2014-02-26: qty 0.5

## 2014-02-26 NOTE — ED Notes (Signed)
PA at the bedside.

## 2014-02-26 NOTE — ED Provider Notes (Signed)
CSN: [id number]     Arrival date & time 02/26/14  [id number] History   First MD Initiated Contact with Patient 02/26/14 [phone]     Chief Complaint  Patient presents with  . Laceration     (Consider location/radiation/quality/duration/timing/severity/associated sxs/prior Treatment) HPI Pt is a 43yo female presenting to ED with laceration to the palm of her right hand after dinner last night.  Pt states she was putting a set of knifes away in a closet when it slipped and fell.  Pt states she controlled the bleeding with direct pressure. Denies pain unless the skin gets hooked on something.  No pain medication PTA. No other injuries. Unsure her last tetanus.  Pt states she is afraid of needles and would prefer wound glue if possible.   Past Medical History  Diagnosis Date  . Cervical dysplasia 2007    laser  . Urinary incontinence     Stress incontinence   Past Surgical History  Procedure Laterality Date  . Tubal ligation    . Laser ablation of the cervix    . Colposcopy    . Foot surgery     Family History  Problem Relation Age of Onset  . Diabetes Maternal Aunt   . Hypertension Maternal Aunt   . Diabetes Paternal Grandmother   . Hypertension Paternal Grandmother    History  Substance Use Topics  . Smoking status: Never Smoker   . Smokeless tobacco: Current User    Types: Snuff     Comment: trying to quit  . Alcohol Use: Yes     Comment: occassionally   OB History    Gravida Para Term Preterm AB TAB SAB Ectopic Multiple Living   5 4 4  1  1   4      Review of Systems  Musculoskeletal: Negative for myalgias and joint swelling.  Skin: Positive for wound ( palm of right hand).  All other systems reviewed and are negative.     Allergies  Review of patient's allergies indicates no known allergies.  Home Medications   Prior to Admission medications   Medication Sig Start Date End Date Taking? Authorizing Provider  fluconazole (DIFLUCAN) 150 MG tablet Take 1 tablet (150  mg total) by mouth once. 11/12/13   Dara Lordsimothy P Fontaine, MD  ipratropium (ATROVENT) 0.06 % nasal spray Place 2 sprays into both nostrils 4 (four) times daily. 07/04/13   Linna HoffJames D Kindl, MD  Multiple Vitamin (MULTIVITAMIN) tablet Take 1 tablet by mouth daily.    Historical Provider, MD   BP 116/75 mmHg  Pulse 100  Temp(Src) 98.5 F (36.9 C) (Oral)  Resp 20  Ht 5\' 10"  (1.778 m)  SpO2 100%  LMP 02/26/2014 Physical Exam  Constitutional: She is oriented to person, place, and time. She appears well-developed and well-nourished.  HENT:  Head: Normocephalic and atraumatic.  Eyes: EOM are normal.  Neck: Normal range of motion.  Cardiovascular: Normal rate.   Right hand: cap refill <3 seconds  Pulmonary/Chest: Effort normal.  Musculoskeletal: Normal range of motion.       Hands: Neurological: She is alert and oriented to person, place, and time.  Skin: Skin is warm and dry.  1.5cm laceration to palm aspect right hand. No active bleeding. No tendon involvement. Small skin flap on proximal aspect of laceration. Minimal edema.   Psychiatric: She has a normal mood and affect. Her behavior is normal.  Nursing note and vitals reviewed.   ED Course  Procedures   LACERATION REPAIR Performed by:  O'MALLEY, Aleta Manternach A. Authorized by: Ina Homes'MALLEY, Kaleb Linquist A. Consent: Verbal consent obtained. Risks and benefits: risks, benefits and alternatives were discussed Consent given by: patient Patient identity confirmed: provided demographic data Prepped and Draped in normal sterile fashion Wound explored  Laceration Location: palm of right hand  Laceration Length: 1.5cm  No Foreign Bodies seen or palpated  Anesthesia: none  Irrigation method: syringe Amount of cleaning: standard  Skin closure: simple, close, adhesive strip and wound glue  Number of adhesive strips: 3  Technique: adhesive strip, wound glue and bandage   Patient tolerance: Patient tolerated the procedure well with no immediate  complications.   Labs Review Labs Reviewed - No data to display  Imaging Review No results found.   EKG Interpretation None      MDM   Final diagnoses:  Hand laceration, right, initial encounter    Pt presenting to ED with 1.5cm laceration to right hand that occurred last night "after dinner."  Tetanus unknown, Tdap given in ED.  Pt requested no sutures be placed as she is terrified of needles.  Adhesive strips and wound glue used then bandaged guaze placed over hand to protect wound. Advised pt to keep hand bandaged when at work to help protect wound as it is on palmer aspect of her hand. Home care instructions provided. Advised to f/u in 1 week for wound check if not improving. Return precautions provided. Pt verbalized understanding and agreement with tx plan.     Junius Finnerrin O'Malley, PA-C 02/26/14 0901  Flint MelterElliott L Wentz, MD 02/26/14 [phone]

## 2014-02-26 NOTE — ED Notes (Signed)
Pt here for laceration to right palm. Denies any pain unless it gets hooked on something.

## 2014-07-29 ENCOUNTER — Emergency Department (INDEPENDENT_AMBULATORY_CARE_PROVIDER_SITE_OTHER)
Admission: EM | Admit: 2014-07-29 | Discharge: 2014-07-29 | Disposition: A | Discharge status: Home / Self Care | Payer: 59 | Source: Home / Self Care | Attending: Emergency Medicine | Admitting: Emergency Medicine

## 2014-07-29 ENCOUNTER — Encounter (HOSPITAL_COMMUNITY): Payer: Self-pay | Admitting: Emergency Medicine

## 2014-07-29 DIAGNOSIS — Z2089 Contact with and (suspected) exposure to other communicable diseases: Secondary | ICD-10-CM

## 2014-07-29 DIAGNOSIS — Z20818 Contact with and (suspected) exposure to other bacterial communicable diseases: Secondary | ICD-10-CM

## 2014-07-29 LAB — POCT RAPID STREP A: STREPTOCOCCUS, GROUP A SCREEN (DIRECT): NEGATIVE

## 2014-07-29 MED ORDER — AMOXICILLIN 500 MG PO CAPS: 500.0000 mg | ORAL_CAPSULE | Freq: Two times a day (BID) | ORAL | Status: DC

## 2014-07-29 NOTE — Discharge Instructions (Signed)
You likely have strep throat. Take amoxicillin twice a day for 10 days. You can use Cepacol lozenges or Chloraseptic spray for symptomatic improvement. Follow-up as needed.

## 2014-07-29 NOTE — ED Notes (Signed)
Pt's son and daughter were both diagnosed with Strep Throat and she has been suffering from a sore throat and a cough for two days.

## 2014-07-29 NOTE — ED Provider Notes (Signed)
CSN: [id number]     Arrival date & time 07/29/14  1419 History   First MD Initiated Contact with Patient 07/29/14 1455     Chief Complaint  Patient presents with  . Sore Throat   (Consider location/radiation/quality/duration/timing/severity/associated sxs/prior Treatment) HPI  She is a 44 year old woman here for evaluation of sore throat. Also of her children were diagnosed with strep throat in the last week. She reports a burning in the back of her throat. She has had headaches. She also reports some mild rhinorrhea, ear pain, cough. No shortness of breath or fevers.  Past Medical History  Diagnosis Date  . Cervical dysplasia 2007    laser  . Urinary incontinence     Stress incontinence   Past Surgical History  Procedure Laterality Date  . Tubal ligation    . Laser ablation of the cervix    . Colposcopy    . Foot surgery     Family History  Problem Relation Age of Onset  . Diabetes Maternal Aunt   . Hypertension Maternal Aunt   . Diabetes Paternal Grandmother   . Hypertension Paternal Grandmother    History  Substance Use Topics  . Smoking status: Never Smoker   . Smokeless tobacco: Current User    Types: Snuff     Comment: trying to quit  . Alcohol Use: Yes     Comment: occassionally   OB History    Gravida Para Term Preterm AB TAB SAB Ectopic Multiple Living   5 4 4  1  1   4      Review of Systems  Constitutional: Negative for fever, chills, activity change and appetite change.  HENT: Positive for ear pain, rhinorrhea and sore throat. Negative for congestion.   Respiratory: Positive for cough. Negative for shortness of breath.   Gastrointestinal: Negative for nausea and vomiting.  Musculoskeletal: Negative for myalgias.  Neurological: Positive for headaches.    Allergies  Review of patient's allergies indicates no known allergies.  Home Medications   Prior to Admission medications   Medication Sig Start Date End Date Taking? Authorizing Provider   Multiple Vitamin (MULTIVITAMIN) tablet Take 1 tablet by mouth daily.   Yes Historical Provider, MD  amoxicillin (AMOXIL) 500 MG capsule Take 1 capsule (500 mg total) by mouth 2 (two) times daily. 07/29/14   Charm RingsErin J Honig, MD   BP 113/84 mmHg  Pulse 93  Temp(Src) 98.4 F (36.9 C) (Oral)  Resp 16  SpO2 96%  LMP 07/20/2014 (Exact Date) Physical Exam  Constitutional: She is oriented to person, place, and time. She appears well-developed and well-nourished. No distress.  HENT:  Head: Normocephalic and atraumatic.  Right Ear: Tympanic membrane normal.  Left Ear: Tympanic membrane normal.  Nose: No mucosal edema or rhinorrhea.  Mouth/Throat: Posterior oropharyngeal erythema present. No oropharyngeal exudate.  Neck: Neck supple.  Cardiovascular: Normal rate, regular rhythm and normal heart sounds.   No murmur heard. Pulmonary/Chest: Effort normal and breath sounds normal. No respiratory distress. She has no wheezes. She has no rales.  Lymphadenopathy:    She has no cervical adenopathy.  Neurological: She is alert and oriented to person, place, and time.    ED Course  Procedures (including critical care time) Labs Review Labs Reviewed  POCT RAPID STREP A ([facility] CARE ONLY)    Imaging Review No results found.   MDM   1. Strep throat exposure    We'll treat with amoxicillin. Follow-up as needed.    Charm RingsErin J Honig, MD  07/29/14 1519 

## 2014-07-31 LAB — CULTURE, GROUP A STREP: Strep A Culture: NEGATIVE

## 2014-11-30 ENCOUNTER — Emergency Department (INDEPENDENT_AMBULATORY_CARE_PROVIDER_SITE_OTHER)
Admission: EM | Admit: 2014-11-30 | Discharge: 2014-11-30 | Disposition: A | Discharge status: Home / Self Care | Payer: 59 | Source: Home / Self Care | Attending: Family Medicine | Admitting: Family Medicine

## 2014-11-30 ENCOUNTER — Emergency Department (INDEPENDENT_AMBULATORY_CARE_PROVIDER_SITE_OTHER): Payer: 59

## 2014-11-30 ENCOUNTER — Encounter (HOSPITAL_COMMUNITY): Payer: Self-pay | Admitting: *Deleted

## 2014-11-30 DIAGNOSIS — S63619A Unspecified sprain of unspecified finger, initial encounter: Secondary | ICD-10-CM

## 2014-11-30 NOTE — ED Notes (Signed)
Pt  Has     Pain  And  Swelling  Of  The  l    Hand      Around  The    Pinky  And       Ring  Finger     -            Pt  denys  Any       specefic       Injury     Pain is  Worse  On     Movement  And  Positions

## 2014-11-30 NOTE — ED Provider Notes (Signed)
CSN: [id number]     Arrival date & time 11/30/14  1438 History   First MD Initiated Contact with Patient 11/30/14 1701     Chief Complaint  Patient presents with  . Hand Problem   (Consider location/radiation/quality/duration/timing/severity/associated sxs/prior Treatment) Patient is a 44 y.o. female presenting with hand pain. The history is provided by the patient.  Hand Pain This is a new problem. The current episode started more than 1 week ago (2 wk ago, pt not certain about exact injury , but I know something happened). The symptoms are aggravated by bending.    Past Medical History  Diagnosis Date  . Cervical dysplasia 2007    laser  . Urinary incontinence     Stress incontinence   Past Surgical History  Procedure Laterality Date  . Tubal ligation    . Laser ablation of the cervix    . Colposcopy    . Foot surgery     Family History  Problem Relation Age of Onset  . Diabetes Maternal Aunt   . Hypertension Maternal Aunt   . Diabetes Paternal Grandmother   . Hypertension Paternal Grandmother    Social History  Substance Use Topics  . Smoking status: Never Smoker   . Smokeless tobacco: Current User    Types: Snuff     Comment: trying to quit  . Alcohol Use: Yes     Comment: occassionally   OB History    Gravida Para Term Preterm AB TAB SAB Ectopic Multiple Living   Review of Systems  Musculoskeletal: Positive for joint swelling. Negative for gait problem.  Skin: Negative.     Allergies  Review of patient's allergies indicates no known allergies.  Home Medications   Prior to Admission medications   Medication Sig Start Date End Date Taking? Authorizing Provider  amoxicillin (AMOXIL) 500 MG capsule Take 1 capsule (500 mg total) by mouth 2 (two) times daily. 07/29/14   Charm Rings, MD  Multiple Vitamin (MULTIVITAMIN) tablet Take 1 tablet by mouth daily.    Historical Provider, MD   BP 111/73 mmHg  Pulse 74  Temp(Src) 97.9 F (36.6  C) (Oral)  Resp 18  SpO2 98%  LMP 11/16/2014 Physical Exam  Constitutional: She is oriented to person, place, and time. She appears well-developed and well-nourished. No distress.  Musculoskeletal: She exhibits tenderness.       Hands: Neurological: She is alert and oriented to person, place, and time.  Skin: Skin is warm and dry.  Nursing note and vitals reviewed.   ED Course  Procedures (including critical care time) Labs Review Labs Reviewed - No data to display  Imaging Review Dg Hand Complete Left  11/30/2014   CLINICAL DATA:  LEFT small finger pain.  Initial encounter.  EXAM: LEFT HAND - COMPLETE 3+ VIEW  COMPARISON:  None.  FINDINGS: There is no evidence of fracture or dislocation. There is no evidence of arthropathy or other focal bone abnormality. Soft tissues are unremarkable.  IMPRESSION: Negative.   Electronically Signed   By: Andreas Newport M.D.   On: 11/30/2014 17:52   X-rays reviewed and report per radiologist.   MDM   1. Sprain of finger of left hand, initial encounter        Linna Hoff, MD 11/30/14 [phone]

## 2014-11-30 NOTE — Discharge Instructions (Signed)
Tape for comfort as needed, see orthopedist if further problems.

## 2015-06-15 ENCOUNTER — Encounter: Payer: Self-pay | Admitting: Gynecology

## 2015-07-09 ENCOUNTER — Ambulatory Visit (INDEPENDENT_AMBULATORY_CARE_PROVIDER_SITE_OTHER): Payer: 59 | Admitting: Gynecology

## 2015-07-09 ENCOUNTER — Encounter: Payer: Self-pay | Admitting: Gynecology

## 2015-07-09 VITALS — BP 124/78 | Ht 70.0 in | Wt 215.0 lb

## 2015-07-09 DIAGNOSIS — N898 Other specified noninflammatory disorders of vagina: Secondary | ICD-10-CM | POA: Diagnosis not present

## 2015-07-09 DIAGNOSIS — Z13220 Encounter for screening for lipoid disorders: Secondary | ICD-10-CM | POA: Diagnosis not present

## 2015-07-09 DIAGNOSIS — Z01419 Encounter for gynecological examination (general) (routine) without abnormal findings: Secondary | ICD-10-CM

## 2015-07-09 DIAGNOSIS — N920 Excessive and frequent menstruation with regular cycle: Secondary | ICD-10-CM

## 2015-07-09 LAB — CBC WITH DIFFERENTIAL/PLATELET
BASOS ABS: 0.1 10*3/uL (ref 0.0–0.1)
Basophils Relative: 1 % (ref 0–1)
Eosinophils Absolute: 0.1 10*3/uL (ref 0.0–0.7)
Eosinophils Relative: 2 % (ref 0–5)
HEMATOCRIT: 36.9 % (ref 36.0–46.0)
Hemoglobin: 12.3 g/dL (ref 12.0–15.0)
LYMPHS PCT: 29 % (ref 12–46)
Lymphs Abs: 1.6 10*3/uL (ref 0.7–4.0)
MCH: 28.5 pg (ref 26.0–34.0)
MCHC: 33.3 g/dL (ref 30.0–36.0)
MCV: 85.4 fL (ref 78.0–100.0)
MONO ABS: 0.4 10*3/uL (ref 0.1–1.0)
MONOS PCT: 8 % (ref 3–12)
MPV: 10.9 fL (ref 8.6–12.4)
Neutro Abs: 3.4 10*3/uL (ref 1.7–7.7)
Neutrophils Relative %: 60 % (ref 43–77)
Platelets: 320 10*3/uL (ref 150–400)
RBC: 4.32 MIL/uL (ref 3.87–5.11)
RDW: 16.2 % — ABNORMAL HIGH (ref 11.5–15.5)
WBC: 5.6 10*3/uL (ref 4.0–10.5)

## 2015-07-09 LAB — URINALYSIS W MICROSCOPIC + REFLEX CULTURE
Bilirubin Urine: NEGATIVE
CRYSTALS: NONE SEEN [HPF]
Casts: NONE SEEN [LPF]
GLUCOSE, UA: NEGATIVE
Ketones, ur: NEGATIVE
LEUKOCYTES UA: NEGATIVE
Nitrite: NEGATIVE
PH: 5.5 (ref 5.0–8.0)
Protein, ur: NEGATIVE
Specific Gravity, Urine: 1.02 (ref 1.001–1.035)
WBC, UA: NONE SEEN WBC/HPF (ref <=5)
Yeast: NONE SEEN [HPF]

## 2015-07-09 LAB — WET PREP FOR TRICH, YEAST, CLUE
CLUE CELLS WET PREP: NONE SEEN
TRICH WET PREP: NONE SEEN
WBC WET PREP: NONE SEEN
Yeast Wet Prep HPF POC: NONE SEEN

## 2015-07-09 MED ORDER — FLUCONAZOLE 200 MG PO TABS: 200.0000 mg | ORAL_TABLET | Freq: Every day | ORAL | Status: DC

## 2015-07-09 NOTE — Patient Instructions (Addendum)
Follow up for ultrasound as scheduled.  Take the Diflucan pills daily for 3 days area  Schedule your mammogram.  You may obtain a copy of any labs that were done today by logging onto MyChart as outlined in the instructions provided with your AVS (after visit summary). The office will not call with normal lab results but certainly if there are any significant abnormalities then we will contact you.   Health Maintenance Adopting a healthy lifestyle and getting preventive care can go a long way to promote health and wellness. Talk with your health care provider about what schedule of regular examinations is right for you. This is a good chance for you to check in with your provider about disease prevention and staying healthy. In between checkups, there are plenty of things you can do on your own. Experts have done a lot of research about which lifestyle changes and preventive measures are most likely to keep you healthy. Ask your health care provider for more information. WEIGHT AND DIET  Eat a healthy diet  Be sure to include plenty of vegetables, fruits, low-fat dairy products, and lean protein.  Do not eat a lot of foods high in solid fats, added sugars, or salt.  Get regular exercise. This is one of the most important things you can do for your health.  Most adults should exercise for at least 150 minutes each week. The exercise should increase your heart rate and make you sweat (moderate-intensity exercise).  Most adults should also do strengthening exercises at least twice a week. This is in addition to the moderate-intensity exercise.  Maintain a healthy weight  Body mass index (BMI) is a measurement that can be used to identify possible weight problems. It estimates body fat based on height and weight. Your health care provider can help determine your BMI and help you achieve or maintain a healthy weight.  For females 61 years of age and older:   A BMI below 18.5 is considered  underweight.  A BMI of 18.5 to 24.9 is normal.  A BMI of 25 to 29.9 is considered overweight.  A BMI of 30 and above is considered obese.  Watch levels of cholesterol and blood lipids  You should start having your blood tested for lipids and cholesterol at 46 years of age, then have this test every 5 years.  You may need to have your cholesterol levels checked more often if:  Your lipid or cholesterol levels are high.  You are older than 46 years of age.  You are at high risk for heart disease.  CANCER SCREENING   Lung Cancer  Lung cancer screening is recommended for adults 63-64 years old who are at high risk for lung cancer because of a history of smoking.  A yearly low-dose CT scan of the lungs is recommended for people who:  Currently smoke.  Have quit within the past 15 years.  Have at least a 30-pack-year history of smoking. A pack year is smoking an average of one pack of cigarettes a day for 1 year.  Yearly screening should continue until it has been 15 years since you quit.  Yearly screening should stop if you develop a health problem that would prevent you from having lung cancer treatment.  Breast Cancer  Practice breast self-awareness. This means understanding how your breasts normally appear and feel.  It also means doing regular breast self-exams. Let your health care provider know about any changes, no matter how small.  If you  are in your 20s or 30s, you should have a clinical breast exam (CBE) by a health care provider every 1-3 years as part of a regular health exam.  If you are 44 or older, have a CBE every year. Also consider having a breast X-ray (mammogram) every year.  If you have a family history of breast cancer, talk to your health care provider about genetic screening.  If you are at high risk for breast cancer, talk to your health care provider about having an MRI and a mammogram every year.  Breast cancer gene (BRCA) assessment is  recommended for women who have family members with BRCA-related cancers. BRCA-related cancers include:  Breast.  Ovarian.  Tubal.  Peritoneal cancers.  Results of the assessment will determine the need for genetic counseling and BRCA1 and BRCA2 testing. Cervical Cancer Routine pelvic examinations to screen for cervical cancer are no longer recommended for nonpregnant women who are considered low risk for cancer of the pelvic organs (ovaries, uterus, and vagina) and who do not have symptoms. A pelvic examination may be necessary if you have symptoms including those associated with pelvic infections. Ask your health care provider if a screening pelvic exam is right for you.   The Pap test is the screening test for cervical cancer for women who are considered at risk.  If you had a hysterectomy for a problem that was not cancer or a condition that could lead to cancer, then you no longer need Pap tests.  If you are older than 65 years, and you have had normal Pap tests for the past 10 years, you no longer need to have Pap tests.  If you have had past treatment for cervical cancer or a condition that could lead to cancer, you need Pap tests and screening for cancer for at least 20 years after your treatment.  If you no longer get a Pap test, assess your risk factors if they change (such as having a new sexual partner). This can affect whether you should start being screened again.  Some women have medical problems that increase their chance of getting cervical cancer. If this is the case for you, your health care provider may recommend more frequent screening and Pap tests.  The human papillomavirus (HPV) test is another test that may be used for cervical cancer screening. The HPV test looks for the virus that can cause cell changes in the cervix. The cells collected during the Pap test can be tested for HPV.  The HPV test can be used to screen women 4 years of age and older. Getting tested  for HPV can extend the interval between normal Pap tests from three to five years.  An HPV test also should be used to screen women of any age who have unclear Pap test results.  After 46 years of age, women should have HPV testing as often as Pap tests.  Colorectal Cancer  This type of cancer can be detected and often prevented.  Routine colorectal cancer screening usually begins at 46 years of age and continues through 46 years of age.  Your health care provider may recommend screening at an earlier age if you have risk factors for colon cancer.  Your health care provider may also recommend using home test kits to check for hidden blood in the stool.  A small camera at the end of a tube can be used to examine your colon directly (sigmoidoscopy or colonoscopy). This is done to check for the earliest  forms of colorectal cancer.  Routine screening usually begins at age 49.  Direct examination of the colon should be repeated every 5-10 years through 46 years of age. However, you may need to be screened more often if early forms of precancerous polyps or small growths are found. Skin Cancer  Check your skin from head to toe regularly.  Tell your health care provider about any new moles or changes in moles, especially if there is a change in a mole's shape or color.  Also tell your health care provider if you have a mole that is larger than the size of a pencil eraser.  Always use sunscreen. Apply sunscreen liberally and repeatedly throughout the day.  Protect yourself by wearing long sleeves, pants, a wide-brimmed hat, and sunglasses whenever you are outside. HEART DISEASE, DIABETES, AND HIGH BLOOD PRESSURE   Have your blood pressure checked at least every 1-2 years. High blood pressure causes heart disease and increases the risk of stroke.  If you are between 48 years and 28 years old, ask your health care provider if you should take aspirin to prevent strokes.  Have regular  diabetes screenings. This involves taking a blood sample to check your fasting blood sugar level.  If you are at a normal weight and have a low risk for diabetes, have this test once every three years after 46 years of age.  If you are overweight and have a high risk for diabetes, consider being tested at a younger age or more often. PREVENTING INFECTION  Hepatitis B  If you have a higher risk for hepatitis B, you should be screened for this virus. You are considered at high risk for hepatitis B if:  You were born in a country where hepatitis B is common. Ask your health care provider which countries are considered high risk.  Your parents were born in a high-risk country, and you have not been immunized against hepatitis B (hepatitis B vaccine).  You have HIV or AIDS.  You use needles to inject street drugs.  You live with someone who has hepatitis B.  You have had sex with someone who has hepatitis B.  You get hemodialysis treatment.  You take certain medicines for conditions, including cancer, organ transplantation, and autoimmune conditions. Hepatitis C  Blood testing is recommended for:  Everyone born from [birth year] through 1966.  Anyone with known risk factors for hepatitis C. Sexually transmitted infections (STIs)  You should be screened for sexually transmitted infections (STIs) including gonorrhea and chlamydia if:  You are sexually active and are younger than 46 years of age.  You are older than 46 years of age and your health care provider tells you that you are at risk for this type of infection.  Your sexual activity has changed since you were last screened and you are at an increased risk for chlamydia or gonorrhea. Ask your health care provider if you are at risk.  If you do not have HIV, but are at risk, it may be recommended that you take a prescription medicine daily to prevent HIV infection. This is called pre-exposure prophylaxis (PrEP). You are considered at  risk if:  You are sexually active and do not regularly use condoms or know the HIV status of your partner(s).  You take drugs by injection.  You are sexually active with a partner who has HIV. Talk with your health care provider about whether you are at high risk of being infected with HIV. If you choose to  begin PrEP, you should first be tested for HIV. You should then be tested every 3 months for as long as you are taking PrEP.  PREGNANCY   If you are premenopausal and you may become pregnant, ask your health care provider about preconception counseling.  If you may become pregnant, take 400 to 800 micrograms (mcg) of folic acid every day.  If you want to prevent pregnancy, talk to your health care provider about birth control (contraception). OSTEOPOROSIS AND MENOPAUSE   Osteoporosis is a disease in which the bones lose minerals and strength with aging. This can result in serious bone fractures. Your risk for osteoporosis can be identified using a bone density scan.  If you are 52 years of age or older, or if you are at risk for osteoporosis and fractures, ask your health care provider if you should be screened.  Ask your health care provider whether you should take a calcium or vitamin D supplement to lower your risk for osteoporosis.  Menopause may have certain physical symptoms and risks.  Hormone replacement therapy may reduce some of these symptoms and risks. Talk to your health care provider about whether hormone replacement therapy is right for you.  HOME CARE INSTRUCTIONS   Schedule regular health, dental, and eye exams.  Stay current with your immunizations.   Do not use any tobacco products including cigarettes, chewing tobacco, or electronic cigarettes.  If you are pregnant, do not drink alcohol.  If you are breastfeeding, limit how much and how often you drink alcohol.  Limit alcohol intake to no more than 1 drink per day for nonpregnant women. One drink equals  12 ounces of beer, 5 ounces of wine, or 1 ounces of hard liquor.  Do not use street drugs.  Do not share needles.  Ask your health care provider for help if you need support or information about quitting drugs.  Tell your health care provider if you often feel depressed.  Tell your health care provider if you have ever been abused or do not feel safe at home. Document Released: 10/17/2010 Document Revised: 08/18/2013 Document Reviewed: 03/05/2013 [facility] Patient Information 2015 [place], [place]. This information is not intended to replace advice given to you by your health care provider. Make sure you discuss any questions you have with your health care provider.

## 2015-07-09 NOTE — Progress Notes (Signed)
    Misty Bates 1970-05-12 [id number]        45 y.o.  [id number]  for annual exam.  Several issues noted below.  Past medical history,surgical history, problem list, medications, allergies, family history and social history were all reviewed and documented as reviewed in the EPIC chart.  ROS:  Performed with pertinent positives and negatives included in the history, assessment and plan.   Additional significant findings :  none   Exam: Misty Bates assistant Filed Vitals:   07/09/15 1530  BP: 124/78  Height: 5\' 10"  (1.778 m)  Weight: 215 lb (97.523 kg)   General appearance:  Normal affect, orientation and appearance. Skin: Grossly normal HEENT: Without gross lesions.  No cervical or supraclavicular adenopathy. Thyroid normal.  Lungs:  Clear without wheezing, rales or rhonchi Cardiac: RR, without RMG Abdominal:  Soft, nontender, without masses, guarding, rebound, organomegaly or hernia Breasts:  Examined lying and sitting without masses, retractions, discharge or axillary adenopathy. Pelvic:  Ext/BUS/vagina with white discharge.  Cervix high and to the left of the vault. Pap smear done  Uterus anteverted, normal size, shape and contour, midline and mobile nontender   Adnexa without masses or tenderness    Anus and perineum normal   Rectovaginal normal sphincter tone without palpated masses or tenderness.    Assessment/Plan:  45 y.o. [id number] female for annual exam with regular menses, tubal sterilization.   1. Menorrhagia. Patient's menses continue heavy. I have asked on several occasions to schedule a sonohysterogram and she never does this. She now agrees to schedule and will go ahead and follow up for this. We'll check her CBC and TSH today. Discussed various possibilities and treatment options depending on what we find. Patient knows the importance of follow up. 2. Vaginal discharge. Patient has slight vaginal discharge. Wet prep is unremarkable. She is having some irritation  and notes that her partner has "jock itch". We'll cover with flu can 200 mg daily 3 days to treat any skin involvement. Follow up if symptoms persist or recur. 3. Pap smear/HPV 11/2011 negative. Smear done today. 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 4. Mammography 04/2013. I reminded the patient she is overdue and the patient agrees to call and schedule. SBE monthly reviewed. 5. Health maintenance. Baseline CBC, CMP, lipid profile, TSH and urinalysis ordered. Follow up for sonohysterogram as scheduled otherwise follow up in one year for annual exam.   Dara LordsFONTAINE,Sparrow [place] P MD, 4:00 PM 07/09/2015

## 2015-07-09 NOTE — Addendum Note (Signed)
Addended by: Dayna BarkerGARDNER, KIMBERLY K on: 07/09/2015 04:09 PM   Modules accepted: Orders

## 2015-07-10 LAB — LIPID PANEL
Cholesterol: 197 mg/dL (ref 125–200)
HDL: 79 mg/dL (ref >=46)
LDL CALC: 64 mg/dL (ref <130)
Total CHOL/HDL Ratio: 2.5 Ratio (ref <=5.0)
Triglycerides: 268 mg/dL — ABNORMAL HIGH (ref <150)
VLDL: 54 mg/dL — ABNORMAL HIGH (ref <30)

## 2015-07-10 LAB — COMPREHENSIVE METABOLIC PANEL
ALK PHOS: 89 U/L (ref 33–115)
ALT: 77 U/L — ABNORMAL HIGH (ref 6–29)
AST: 71 U/L — ABNORMAL HIGH (ref 10–35)
Albumin: 3.8 g/dL (ref 3.6–5.1)
BUN: 13 mg/dL (ref 7–25)
CO2: 20 mmol/L (ref 20–31)
CREATININE: 0.78 mg/dL (ref 0.50–1.10)
Calcium: 9.2 mg/dL (ref 8.6–10.2)
Chloride: 102 mmol/L (ref 98–110)
Glucose, Bld: 112 mg/dL — ABNORMAL HIGH (ref 65–99)
Potassium: 3.8 mmol/L (ref 3.5–5.3)
SODIUM: 136 mmol/L (ref 135–146)
TOTAL PROTEIN: 6.9 g/dL (ref 6.1–8.1)
Total Bilirubin: 0.4 mg/dL (ref 0.2–1.2)

## 2015-07-10 LAB — TSH: TSH: 0.87 mIU/L

## 2015-07-11 LAB — URINE CULTURE
Colony Count: NO GROWTH
Organism ID, Bacteria: NO GROWTH

## 2015-07-12 ENCOUNTER — Other Ambulatory Visit: Payer: Self-pay | Admitting: Gynecology

## 2015-07-12 DIAGNOSIS — R7309 Other abnormal glucose: Secondary | ICD-10-CM

## 2015-07-12 DIAGNOSIS — E781 Pure hyperglyceridemia: Secondary | ICD-10-CM

## 2015-07-12 LAB — PAP IG W/ RFLX HPV ASCU

## 2015-07-13 ENCOUNTER — Other Ambulatory Visit: Payer: 59

## 2015-07-13 ENCOUNTER — Telehealth: Payer: Self-pay

## 2015-07-13 DIAGNOSIS — E781 Pure hyperglyceridemia: Secondary | ICD-10-CM

## 2015-07-13 DIAGNOSIS — R7309 Other abnormal glucose: Secondary | ICD-10-CM

## 2015-07-13 LAB — LIPID PANEL
CHOL/HDL RATIO: 2.4 ratio (ref <=5.0)
CHOLESTEROL: 198 mg/dL (ref 125–200)
HDL: 83 mg/dL (ref >=46)
LDL Cholesterol: 92 mg/dL (ref <130)
TRIGLYCERIDES: 113 mg/dL (ref <150)
VLDL: 23 mg/dL (ref <30)

## 2015-07-13 LAB — GLUCOSE, RANDOM: GLUCOSE: 85 mg/dL (ref 65–99)

## 2015-07-13 NOTE — Telephone Encounter (Signed)
Patient came in for labs. She spoke with Debarah Crapelaudia. Per our conversation yesterday about her elevated liver enzymes she told Debarah CrapeClaudia that her enzymes run high and normal for her. She did tell me she had work up for it and GI told her normal for her. She told Debarah CrapeClaudia it was called "bilirubin".  We did not recheck those today at her request as she did not believe it was needed.

## 2015-07-14 ENCOUNTER — Other Ambulatory Visit: Payer: Self-pay | Admitting: Gynecology

## 2015-07-14 DIAGNOSIS — N939 Abnormal uterine and vaginal bleeding, unspecified: Secondary | ICD-10-CM

## 2015-07-14 LAB — HEMOGLOBIN A1C
HEMOGLOBIN A1C: 5.6 % (ref <5.7)
MEAN PLASMA GLUCOSE: 114 mg/dL

## 2015-07-27 ENCOUNTER — Telehealth: Payer: Self-pay | Admitting: Gynecology

## 2015-07-27 NOTE — Telephone Encounter (Signed)
07/27/15-Pt sonohysterogram is covered at 100% per [place] @[employer] . Ref #[id number]. Problem with patient phone today. Unable to LM.wl

## 2015-08-06 ENCOUNTER — Ambulatory Visit: Payer: 59 | Admitting: Gynecology

## 2015-08-06 ENCOUNTER — Other Ambulatory Visit: Payer: 59

## 2015-08-10 ENCOUNTER — Encounter: Payer: Self-pay | Admitting: Gynecology

## 2015-08-10 ENCOUNTER — Other Ambulatory Visit: Payer: Self-pay

## 2015-08-10 ENCOUNTER — Ambulatory Visit (INDEPENDENT_AMBULATORY_CARE_PROVIDER_SITE_OTHER): Payer: 59 | Admitting: Gynecology

## 2015-08-10 ENCOUNTER — Telehealth: Payer: Self-pay

## 2015-08-10 VITALS — BP 118/76

## 2015-08-10 DIAGNOSIS — B373 Candidiasis of vulva and vagina: Secondary | ICD-10-CM

## 2015-08-10 DIAGNOSIS — R350 Frequency of micturition: Secondary | ICD-10-CM

## 2015-08-10 DIAGNOSIS — N644 Mastodynia: Secondary | ICD-10-CM | POA: Diagnosis not present

## 2015-08-10 DIAGNOSIS — N3000 Acute cystitis without hematuria: Secondary | ICD-10-CM

## 2015-08-10 DIAGNOSIS — B3731 Acute candidiasis of vulva and vagina: Secondary | ICD-10-CM

## 2015-08-10 DIAGNOSIS — R5381 Other malaise: Secondary | ICD-10-CM

## 2015-08-10 LAB — URINALYSIS W MICROSCOPIC + REFLEX CULTURE
Bilirubin Urine: NEGATIVE
CASTS: NONE SEEN [LPF]
Crystals: NONE SEEN [HPF]
Glucose, UA: NEGATIVE
Ketones, ur: NEGATIVE
NITRITE: NEGATIVE
Protein, ur: NEGATIVE
SPECIFIC GRAVITY, URINE: 1.01 (ref 1.001–1.035)
pH: 5.5 (ref 5.0–8.0)

## 2015-08-10 MED ORDER — SULFAMETHOXAZOLE-TRIMETHOPRIM 800-160 MG PO TABS: 1.0000 | ORAL_TABLET | Freq: Two times a day (BID) | ORAL | Status: DC

## 2015-08-10 MED ORDER — TERCONAZOLE 0.8 % VA CREA: 1.0000 | TOPICAL_CREAM | Freq: Every day | VAGINAL | Status: DC

## 2015-08-10 NOTE — Telephone Encounter (Signed)
If this is a new finding then I need to see her

## 2015-08-10 NOTE — Telephone Encounter (Signed)
Patient informed and transferred to appt desk.

## 2015-08-10 NOTE — Progress Notes (Signed)
    Misty Bates November 11, 1970 [id number]        45 y.o.  [id number] Presents with 2 issues:  1. Left breast pain that has been present for several years. Seems to be slowly getting worse. No nipple discharge or masses felt on self exam. Last mammogram 2015 was negative. 2. Urinary frequency and some low back pain that's been going on for a month or so. Did not mention to me at her annual exam one month ago. Urinalysis was negative at that time. No urgency or dysuria. No vaginal discharge, itching or irritation  Past medical history,surgical history, problem list, medications, allergies, family history and social history were all reviewed and documented in the EPIC chart.  Directed ROS with pertinent positives and negatives documented in the history of present illness/assessment and plan.  Exam: Kennon PortelaKim Gardner assistant Filed Vitals:   08/10/15 1439  BP: 118/76   General appearance:  Normal Both breasts examined lying and sitting without masses retractions discharge adenopathy. Left breast is diffusely tender. Spine straight without CVA tenderness Abdomen soft nontender without masses guarding rebound  Assessment/Plan:  45 y.o. [id number] with:  1. Left breast mastalgia present for several years. Exam is negative. We'll plan diagnostic mammography and ultrasound. Patient knows importance of follow up and to expect a call to schedule. If she does not hear from them within several day she noticed call my office. We'll also check baseline prolactin level. 2. Urinary frequency. Urinalysis shows 20-40 WBC was may bacteria. Also shows moderate yeast. Will cover with Septra DS by mouth twice a day 3 days and Terazol 3 day cream noting she was treated with Diflucan at her office visit 1 month ago. Follow up if symptoms persist, worsen or recur.    Dara LordsFONTAINE,Jera Headings P MD, 3:09 PM 08/10/2015

## 2015-08-10 NOTE — Patient Instructions (Signed)
Radiology office should call to arrange the mammogram. Call my office if you do not hear within several days.  Take the Septra antibiotics twice daily for 3 days Use the vaginal yeast cream nightly for 3 nights. Follow up if your symptoms persist worsen or recur.

## 2015-08-10 NOTE — Telephone Encounter (Signed)
Patient said she called to make screening mammo appt and mentioned that her left breast has been hurting. She said a certain placed when she touches is very painful. She does note feel a lump or anything different. Does not hurt constantly just with touching that area.  She also complained of the nipple on that breast itching a little and she has some cream she has been using on it.  She was just here a month ago and you examined her breasts. I did not know if you would want to see her again or just order diagnostic mammogram?   (She uses [facility].)

## 2015-08-11 LAB — PROLACTIN: PROLACTIN: 12.2 ng/mL

## 2015-08-11 LAB — URINE CULTURE

## 2015-08-19 ENCOUNTER — Telehealth: Payer: Self-pay | Admitting: *Deleted

## 2015-08-19 DIAGNOSIS — N644 Mastodynia: Secondary | ICD-10-CM

## 2015-08-19 NOTE — Telephone Encounter (Signed)
-----   Message from Keenan BachelorKatherine R Annas, ArizonaRMA sent at 08/18/2015  3:35 PM EDT ----- Regarding: appt needed Patient called because she was in recently and has not heard anything.  Dr. Velvet BatheF note says "1.   Left breast mastalgia present for several years. Exam is negative. We'll plan diagnostic mammography and ultrasound. Patient knows importance of follow up and to expect a call to schedule. If she does not hear from them within several day she noticed call my office."  Thanks!

## 2015-08-19 NOTE — Telephone Encounter (Signed)
appt 08/24/15 @ 3:20pm at breast center.

## 2015-08-19 NOTE — Telephone Encounter (Signed)
The below request was never sent to me to schedule appointment. Orders have been placed breast center will contact pt to schedule.

## 2015-08-24 ENCOUNTER — Ambulatory Visit
Admission: RE | Admit: 2015-08-24 | Discharge: 2015-08-24 | Disposition: A | Discharge status: Home / Self Care | Payer: 59 | Source: Ambulatory Visit | Attending: Gynecology | Admitting: Gynecology

## 2015-08-24 DIAGNOSIS — N644 Mastodynia: Secondary | ICD-10-CM

## 2016-02-17 ENCOUNTER — Telehealth: Payer: Self-pay | Admitting: *Deleted

## 2016-02-17 NOTE — Telephone Encounter (Signed)
Pt called c/o vaginal itching, tried OTC monistat and some relief, but not fully. Pt cycle started today unable to come in, asked if diflucan tablet could be sent? And if no better she will schedule OV. Please advise

## 2016-02-17 NOTE — Telephone Encounter (Signed)
okay for Diflucan 150 mg 1 dose 

## 2016-02-18 MED ORDER — FLUCONAZOLE 150 MG PO TABS: 150.0000 mg | ORAL_TABLET | Freq: Once | ORAL | 0 refills | Status: AC

## 2016-02-18 NOTE — Telephone Encounter (Signed)
Pt aware of the below, Rx sent. 

## 2016-02-18 NOTE — Addendum Note (Signed)
Addended by: Aura CampsWEBB, Kristle Wesch L on: 02/18/2016 08:11 AM   Modules accepted: Orders

## 2016-04-12 ENCOUNTER — Ambulatory Visit (HOSPITAL_COMMUNITY)
Admission: EM | Admit: 2016-04-12 | Discharge: 2016-04-12 | Disposition: A | Discharge status: Home / Self Care | Payer: 59 | Attending: Family Medicine | Admitting: Family Medicine

## 2016-04-12 ENCOUNTER — Ambulatory Visit: Payer: 59 | Admitting: Women's Health

## 2016-04-12 ENCOUNTER — Encounter (HOSPITAL_COMMUNITY): Payer: Self-pay | Admitting: Emergency Medicine

## 2016-04-12 ENCOUNTER — Telehealth: Payer: Self-pay

## 2016-04-12 DIAGNOSIS — R110 Nausea: Secondary | ICD-10-CM | POA: Diagnosis not present

## 2016-04-12 DIAGNOSIS — R350 Frequency of micturition: Secondary | ICD-10-CM | POA: Insufficient documentation

## 2016-04-12 DIAGNOSIS — R1032 Left lower quadrant pain: Secondary | ICD-10-CM | POA: Diagnosis not present

## 2016-04-12 LAB — POCT URINALYSIS DIP (DEVICE)
BILIRUBIN URINE: NEGATIVE
Glucose, UA: NEGATIVE mg/dL
HGB URINE DIPSTICK: NEGATIVE
Ketones, ur: NEGATIVE mg/dL
Nitrite: NEGATIVE
PH: 7 (ref 5.0–8.0)
Protein, ur: NEGATIVE mg/dL
SPECIFIC GRAVITY, URINE: 1.02 (ref 1.005–1.030)
Urobilinogen, UA: 0.2 mg/dL (ref 0.0–1.0)

## 2016-04-12 MED ORDER — CIPROFLOXACIN HCL 500 MG PO TABS: 500.0000 mg | ORAL_TABLET | Freq: Two times a day (BID) | ORAL | 0 refills | Status: AC

## 2016-04-12 MED ORDER — FLUCONAZOLE 150 MG PO TABS: 150.0000 mg | ORAL_TABLET | Freq: Every day | ORAL | 0 refills | Status: AC

## 2016-04-12 NOTE — ED Provider Notes (Signed)
CSN: [id number]     Arrival date & time 04/12/16  1020 History   First MD Initiated Contact with Patient 04/12/16 1141     No chief complaint on file.  (Consider location/radiation/quality/duration/timing/severity/associated sxs/prior Treatment) Patient is a well-appearing 45 year old female, presents today for left lower quadrant pain of 1 week's duration. Abdominal pain is localized at the left lower quadrant, without radiation and  pain is intermittent. Patient states that when the pain hits, it feels devastating at 9 out of 10. Patient reports that the abdominal pain goes away by taking Advil. Patient also endorses possible fever at home as she felt warm and had chills. Patient also endorses urinary frequency and flank pain that is also intermittent. She denies dysuria. She has positive nausea but no vomiting. Patient had tubal ligation, reports that her menstrual cycle is regular, however she missed her menstrual cycle this month. Vaginal discharge not any different than normal discharge. She is sexually active.           Past Medical History:  Diagnosis Date  . 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 Urinary incontinence    Stress incontinence   Past Surgical History:  Procedure Laterality Date  . COLPOSCOPY    . FOOT SURGERY    . LASER ABLATION OF THE CERVIX    . TUBAL LIGATION     Family History  Problem Relation Age of Onset  . Diabetes Maternal Aunt   . Hypertension Maternal Aunt   . Diabetes Paternal Grandmother   . Hypertension Paternal Grandmother    Social History  Substance Use Topics  . Smoking status: Never Smoker  . Smokeless tobacco: Current User    Types: Snuff     Comment: trying to quit  . Alcohol use 0.0 oz/week     Comment: occassionally   OB History    Gravida Para Term Preterm AB Living   5 4 4   1 4    SAB TAB Ectopic Multiple Live Births   1             Review of Systems  Constitutional:       As stated in the HPI    Allergies  Patient  has no known allergies.  Home Medications   Prior to Admission medications   Medication Sig Start Date End Date Taking? Authorizing Provider  ketoconazole (NIZORAL) 2 % cream Apply 1 application topically daily.    Historical Provider, MD  Meloxicam (MOBIC PO) Take by mouth.    Historical Provider, MD  Probiotic Product (PROBIOTIC PO) Take by mouth.    Historical Provider, MD  sulfamethoxazole-trimethoprim (BACTRIM DS,SEPTRA DS) 800-160 MG tablet Take 1 tablet by mouth 2 (two) times daily. 08/10/15   Dara Lordsimothy P Fontaine, MD  terconazole (TERAZOL 3) 0.8 % vaginal cream Place 1 applicator vaginally at bedtime. For 3 nights 08/10/15   Dara Lordsimothy P Fontaine, MD   Meds Ordered and Administered this Visit  Medications - No data to display  BP 125/69   Pulse 77   Temp 98.4 F (36.9 C) (Oral)   Resp 16   Ht 5\' 10"  (1.778 m)   Wt 215 lb (97.5 kg)   LMP 02/17/2016   SpO2 100%   BMI 30.85 kg/m  No data found.   Physical Exam  Constitutional: She is oriented to person, place, and time. She appears well-developed and well-nourished. No distress.  HENT:  Head: Normocephalic and atraumatic.  Eyes: Conjunctivae are normal.  Neck: Normal range of motion. Neck  supple.  Cardiovascular: Normal rate, regular rhythm and normal heart sounds.   No murmur heard. Pulmonary/Chest: Effort normal and breath sounds normal.  Abdominal: Soft. Bowel sounds are normal. She exhibits no distension and no mass. There is no tenderness. There is no guarding.  Genitourinary:  Genitourinary Comments: Negative CVA tenderness  Musculoskeletal: Normal range of motion.  Lymphadenopathy:    She has no cervical adenopathy.  Neurological: She is alert and oriented to person, place, and time.  Skin: Skin is warm and dry. She is not diaphoretic.  Psychiatric: She has a normal mood and affect.  Nursing note and vitals reviewed.   Urgent Care Course   Clinical Course     Procedures (including critical care  time)  Labs Review Labs Reviewed  POCT URINALYSIS DIP (DEVICE) - Abnormal; Notable for the following:       Result Value   Leukocytes, UA TRACE (*)    All other components within normal limits  URINE CULTURE    Imaging Review No results found.   MDM   1. Abdominal pain, acute, left lower quadrant    Physical examination unremarkable. UA preg negative. Urinalysis shows trace amount of Leukocytes. Urine culture ordered and pending. Diverticulitis is also in differential.  1) Will treat today for possible UTI with CIPRO 500mg  BID x 5 days 2) Diflucan 150 mg given to prevent yeast infection 3) Patient educated to return if she does not improve with CIPRO.     Lucia EstelleFeng Jamarcus Laduke, NP 04/12/16 1234

## 2016-04-12 NOTE — ED Triage Notes (Signed)
PT reports left sided abdominal pain for 1 week. PT reports she is six days late for her menstrual . PT reports nausea and intermittent diarrhea with pain. PT reports last BM was this morning and was diarrhea. PT also reports urinary frequency but denies pain with urination.

## 2016-04-12 NOTE — Telephone Encounter (Signed)
Appt scheduled with [place] for 12:20pm today.

## 2016-04-12 NOTE — Telephone Encounter (Signed)
Patient called. Complained of amenorrhea, nausea and left sided pain around her ovary.  She said last mos felt like ovary was going to explode.I recommended she schedule office visit to be examined as she was asking me what I thought it was. She asked if she should schedule u/s at the same time.  I told her to go ahead and schedule RG appt and let Dr. Velvet BatheF examine and assess and he will order u/s if appropriate. Debarah CrapeClaudia will call her back to schedule office visit.

## 2016-04-14 LAB — URINE CULTURE

## 2016-05-18 ENCOUNTER — Emergency Department (HOSPITAL_COMMUNITY): Payer: Medicaid Other

## 2016-05-18 ENCOUNTER — Emergency Department (HOSPITAL_COMMUNITY): Payer: Medicaid Other | Admitting: Anesthesiology

## 2016-05-18 ENCOUNTER — Encounter (HOSPITAL_COMMUNITY): Payer: Self-pay | Admitting: Emergency Medicine

## 2016-05-18 ENCOUNTER — Observation Stay (HOSPITAL_COMMUNITY)
Admission: EM | Admit: 2016-05-18 | Discharge: 2016-05-19 | Disposition: A | Discharge status: Home / Self Care | Payer: Medicaid Other | Attending: Orthopedic Surgery | Admitting: Orthopedic Surgery

## 2016-05-18 ENCOUNTER — Encounter (HOSPITAL_COMMUNITY)
Admission: EM | Disposition: A | Discharge status: Home / Self Care | Payer: Self-pay | Source: Home / Self Care | Attending: Emergency Medicine

## 2016-05-18 DIAGNOSIS — S5291XA Unspecified fracture of right forearm, initial encounter for closed fracture: Secondary | ICD-10-CM

## 2016-05-18 DIAGNOSIS — S62610A Displaced fracture of proximal phalanx of right index finger, initial encounter for closed fracture: Secondary | ICD-10-CM | POA: Insufficient documentation

## 2016-05-18 DIAGNOSIS — S52291A Other fracture of shaft of right ulna, initial encounter for closed fracture: Secondary | ICD-10-CM | POA: Diagnosis not present

## 2016-05-18 DIAGNOSIS — F17290 Nicotine dependence, other tobacco product, uncomplicated: Secondary | ICD-10-CM | POA: Diagnosis not present

## 2016-05-18 DIAGNOSIS — S62608A Fracture of unspecified phalanx of other finger, initial encounter for closed fracture: Secondary | ICD-10-CM

## 2016-05-18 DIAGNOSIS — S52591A Other fractures of lower end of right radius, initial encounter for closed fracture: Secondary | ICD-10-CM | POA: Diagnosis not present

## 2016-05-18 HISTORY — PX: OPEN REDUCTION INTERNAL FIXATION (ORIF) DISTAL RADIAL FRACTURE: SHX5989

## 2016-05-18 LAB — CBC WITH DIFFERENTIAL/PLATELET
BASOS ABS: 0.1 10*3/uL (ref 0.0–0.1)
Basophils Relative: 1 %
EOS ABS: 0.1 10*3/uL (ref 0.0–0.7)
Eosinophils Relative: 1 %
HCT: 34.1 % — ABNORMAL LOW (ref 36.0–46.0)
Hemoglobin: 10.5 g/dL — ABNORMAL LOW (ref 12.0–15.0)
LYMPHS ABS: 2.2 10*3/uL (ref 0.7–4.0)
Lymphocytes Relative: 34 %
MCH: 22.9 pg — ABNORMAL LOW (ref 26.0–34.0)
MCHC: 30.8 g/dL (ref 30.0–36.0)
MCV: 74.5 fL — ABNORMAL LOW (ref 78.0–100.0)
MONO ABS: 0.5 10*3/uL (ref 0.1–1.0)
Monocytes Relative: 8 %
NEUTROS PCT: 56 %
Neutro Abs: 3.5 10*3/uL (ref 1.7–7.7)
Platelets: 325 10*3/uL (ref 150–400)
RBC: 4.58 MIL/uL (ref 3.87–5.11)
RDW: 20.9 % — AB (ref 11.5–15.5)
WBC: 6.4 10*3/uL (ref 4.0–10.5)

## 2016-05-18 LAB — BASIC METABOLIC PANEL
ANION GAP: 10 (ref 5–15)
BUN: 10 mg/dL (ref 6–20)
CALCIUM: 9.3 mg/dL (ref 8.9–10.3)
CO2: 21 mmol/L — ABNORMAL LOW (ref 22–32)
Chloride: 106 mmol/L (ref 101–111)
Creatinine, Ser: 0.78 mg/dL (ref 0.44–1.00)
GFR calc Af Amer: >60 mL/min (ref >60)
GLUCOSE: 159 mg/dL — AB (ref 65–99)
POTASSIUM: 3.4 mmol/L — AB (ref 3.5–5.1)
SODIUM: 137 mmol/L (ref 135–145)

## 2016-05-18 SURGERY — OPEN REDUCTION INTERNAL FIXATION (ORIF) DISTAL RADIUS FRACTURE
Anesthesia: General | Site: Arm Lower | Laterality: Right

## 2016-05-18 MED ORDER — FENTANYL CITRATE (PF) 100 MCG/2ML IJ SOLN
50.0000 ug | Freq: Once | INTRAMUSCULAR | Status: AC
  Administered 2016-05-18: 50 ug via INTRAVENOUS

## 2016-05-18 MED ORDER — ONDANSETRON HCL 4 MG/2ML IJ SOLN
4.0000 mg | Freq: Once | INTRAMUSCULAR | Status: AC
  Administered 2016-05-18: 4 mg via INTRAVENOUS
  Filled 2016-05-18: qty 2

## 2016-05-18 MED ORDER — FENTANYL CITRATE (PF) 100 MCG/2ML IJ SOLN
INTRAMUSCULAR | Status: DC | PRN
  Administered 2016-05-18 (×6): 50 ug via INTRAVENOUS

## 2016-05-18 MED ORDER — KETOROLAC TROMETHAMINE 30 MG/ML IJ SOLN
30.0000 mg | Freq: Once | INTRAMUSCULAR | Status: AC | PRN
  Administered 2016-05-18: 30 mg via INTRAVENOUS

## 2016-05-18 MED ORDER — FENTANYL CITRATE (PF) 100 MCG/2ML IJ SOLN
50.0000 ug | Freq: Once | INTRAMUSCULAR | Status: AC
  Administered 2016-05-18: 50 ug via INTRAVENOUS
  Filled 2016-05-18: qty 2

## 2016-05-18 MED ORDER — KETOROLAC TROMETHAMINE 30 MG/ML IJ SOLN
INTRAMUSCULAR | Status: AC
  Filled 2016-05-18: qty 1

## 2016-05-18 MED ORDER — FENTANYL CITRATE (PF) 100 MCG/2ML IJ SOLN
INTRAMUSCULAR | Status: AC
  Filled 2016-05-18: qty 2

## 2016-05-18 MED ORDER — SUCCINYLCHOLINE CHLORIDE 200 MG/10ML IV SOSY
PREFILLED_SYRINGE | INTRAVENOUS | Status: AC
  Filled 2016-05-18: qty 10

## 2016-05-18 MED ORDER — SODIUM CHLORIDE 0.9 % IV SOLN
INTRAVENOUS | Status: DC | PRN
  Administered 2016-05-18: 20:00:00 via INTRAVENOUS

## 2016-05-18 MED ORDER — TETANUS-DIPHTH-ACELL PERTUSSIS 5-2.5-18.5 LF-MCG/0.5 IM SUSP
0.5000 mL | Freq: Once | INTRAMUSCULAR | Status: AC
  Administered 2016-05-18: 0.5 mL via INTRAMUSCULAR
  Filled 2016-05-18: qty 0.5

## 2016-05-18 MED ORDER — BUPIVACAINE HCL (PF) 0.25 % IJ SOLN
INTRAMUSCULAR | Status: AC
  Filled 2016-05-18: qty 30

## 2016-05-18 MED ORDER — ACETAMINOPHEN 325 MG PO TABS: 650.0000 mg | ORAL_TABLET | Freq: Four times a day (QID) | ORAL | Status: DC | PRN

## 2016-05-18 MED ORDER — SUCCINYLCHOLINE CHLORIDE 20 MG/ML IJ SOLN
INTRAMUSCULAR | Status: DC | PRN
  Administered 2016-05-18: 100 mg via INTRAVENOUS

## 2016-05-18 MED ORDER — DEXAMETHASONE SODIUM PHOSPHATE 10 MG/ML IJ SOLN
INTRAMUSCULAR | Status: DC | PRN
  Administered 2016-05-18: 10 mg via INTRAVENOUS

## 2016-05-18 MED ORDER — MIDAZOLAM HCL 2 MG/2ML IJ SOLN
INTRAMUSCULAR | Status: AC
  Filled 2016-05-18: qty 2

## 2016-05-18 MED ORDER — LIDOCAINE HCL (PF) 0.5 % IJ SOLN
INTRAMUSCULAR | Status: AC
  Filled 2016-05-18: qty 50

## 2016-05-18 MED ORDER — OXYCODONE HCL 5 MG PO TABS: 5.0000 mg | ORAL_TABLET | ORAL | 0 refills | Status: DC | PRN

## 2016-05-18 MED ORDER — HYDROMORPHONE HCL 1 MG/ML IJ SOLN
0.2500 mg | INTRAMUSCULAR | Status: DC | PRN
Start: 2016-05-18 — End: 2016-05-19
  Administered 2016-05-18 (×2): 0.5 mg via INTRAVENOUS

## 2016-05-18 MED ORDER — CEPHALEXIN 500 MG PO CAPS: 500.0000 mg | ORAL_CAPSULE | Freq: Four times a day (QID) | ORAL | 0 refills | Status: AC

## 2016-05-18 MED ORDER — HYDROMORPHONE HCL 1 MG/ML IJ SOLN
INTRAMUSCULAR | Status: AC
  Filled 2016-05-18: qty 1

## 2016-05-18 MED ORDER — PROPOFOL 10 MG/ML IV BOLUS
INTRAVENOUS | Status: DC | PRN
  Administered 2016-05-18: 140 mg via INTRAVENOUS

## 2016-05-18 MED ORDER — PROMETHAZINE HCL 25 MG/ML IJ SOLN: 6.2500 mg | INTRAMUSCULAR | Status: DC | PRN

## 2016-05-18 MED ORDER — DEXAMETHASONE SODIUM PHOSPHATE 10 MG/ML IJ SOLN
INTRAMUSCULAR | Status: AC
  Filled 2016-05-18: qty 1

## 2016-05-18 MED ORDER — FENTANYL CITRATE (PF) 100 MCG/2ML IJ SOLN
INTRAMUSCULAR | Status: AC
  Filled 2016-05-18: qty 4

## 2016-05-18 MED ORDER — ONDANSETRON HCL 4 MG/2ML IJ SOLN
INTRAMUSCULAR | Status: DC | PRN
  Administered 2016-05-18: 4 mg via INTRAVENOUS

## 2016-05-18 MED ORDER — LACTATED RINGERS IV SOLN
INTRAVENOUS | Status: DC | PRN
  Administered 2016-05-18: 21:00:00 via INTRAVENOUS

## 2016-05-18 MED ORDER — CEFAZOLIN SODIUM-DEXTROSE 2-4 GM/100ML-% IV SOLN
2.0000 g | INTRAVENOUS | Status: AC
  Administered 2016-05-18: 2 g via INTRAVENOUS

## 2016-05-18 MED ORDER — HYDROMORPHONE HCL 2 MG/ML IJ SOLN
1.0000 mg | Freq: Once | INTRAMUSCULAR | Status: AC
  Administered 2016-05-18: 1 mg via INTRAVENOUS
  Filled 2016-05-18: qty 1

## 2016-05-18 MED ORDER — ONDANSETRON HCL 4 MG/2ML IJ SOLN
INTRAMUSCULAR | Status: AC
  Filled 2016-05-18: qty 2

## 2016-05-18 MED ORDER — LIDOCAINE HCL (PF) 1 % IJ SOLN
INTRAMUSCULAR | Status: AC
  Filled 2016-05-18: qty 30

## 2016-05-18 MED ORDER — LIDOCAINE HCL (CARDIAC) 20 MG/ML IV SOLN
INTRAVENOUS | Status: DC | PRN
  Administered 2016-05-18 (×2): 60 mg via INTRATRACHEAL

## 2016-05-18 MED ORDER — LIDOCAINE 2% (20 MG/ML) 5 ML SYRINGE
INTRAMUSCULAR | Status: AC
  Filled 2016-05-18: qty 5

## 2016-05-18 MED ORDER — 0.9 % SODIUM CHLORIDE (POUR BTL) OPTIME
TOPICAL | Status: DC | PRN
  Administered 2016-05-18: 1000 mL

## 2016-05-18 MED ORDER — MIDAZOLAM HCL 2 MG/2ML IJ SOLN
INTRAMUSCULAR | Status: DC | PRN
  Administered 2016-05-18: 2 mg via INTRAVENOUS

## 2016-05-18 MED ORDER — PROPOFOL 10 MG/ML IV BOLUS
INTRAVENOUS | Status: AC
  Filled 2016-05-18: qty 20

## 2016-05-18 SURGICAL SUPPLY — 77 items
BANDAGE COBAN STERILE 2 (GAUZE/BANDAGES/DRESSINGS) IMPLANT
BANDAGE ELASTIC 4 VELCRO ST LF (GAUZE/BANDAGES/DRESSINGS) ×4 IMPLANT
BIT DRILL 1.1 MINI (BIT) ×1 IMPLANT
BIT DRILL 2.5X2.75 QC CALB (BIT) ×2 IMPLANT
BIT DRILL 3.5X5.5 QC CALB (BIT) ×2 IMPLANT
BIT DRILL CALIBRATED 2.7 (BIT) ×2 IMPLANT
BIT DRILL STD 2.0MM (DRILL) ×1 IMPLANT
BLADE SURG 15 STRL LF DISP TIS (BLADE) ×2 IMPLANT
BLADE SURG 15 STRL SS (BLADE) ×2
BNDG COHESIVE 4X5 TAN STRL (GAUZE/BANDAGES/DRESSINGS) ×2 IMPLANT
BNDG ESMARK 4X9 LF (GAUZE/BANDAGES/DRESSINGS) ×2 IMPLANT
BNDG GAUZE ELAST 4 BULKY (GAUZE/BANDAGES/DRESSINGS) ×4 IMPLANT
BRUSH SCRUB EZ PLAIN DRY (MISCELLANEOUS) ×2 IMPLANT
CANISTER SUCTION 2500CC (MISCELLANEOUS) ×2 IMPLANT
CHLORAPREP W/TINT 26ML (MISCELLANEOUS) IMPLANT
CORDS BIPOLAR (ELECTRODE) ×2 IMPLANT
COVER SURGICAL LIGHT HANDLE (MISCELLANEOUS) ×2 IMPLANT
COVER TABLE BACK 60X90 (DRAPES) ×2 IMPLANT
CUFF TOURNIQUET SINGLE 18IN (TOURNIQUET CUFF) ×2 IMPLANT
CUFF TOURNIQUET SINGLE 24IN (TOURNIQUET CUFF) IMPLANT
DRAPE C-ARM 42X72 X-RAY (DRAPES) ×2 IMPLANT
DRAPE SURG 17X23 STRL (DRAPES) ×2 IMPLANT
DRILL BIT 1.1 MINI (BIT) ×1
DRILL STANDARD 2.0MM (DRILL) ×2
DRSG ADAPTIC 3X8 NADH LF (GAUZE/BANDAGES/DRESSINGS) ×4 IMPLANT
DRSG EMULSION OIL 3X3 NADH (GAUZE/BANDAGES/DRESSINGS) IMPLANT
GAUZE SPONGE 4X4 12PLY STRL (GAUZE/BANDAGES/DRESSINGS) ×2 IMPLANT
GLOVE BIO SURGEON STRL SZ 6.5 (GLOVE) ×2 IMPLANT
GLOVE BIO SURGEON STRL SZ7.5 (GLOVE) ×4 IMPLANT
GLOVE BIOGEL PI IND STRL 8 (GLOVE) ×1 IMPLANT
GLOVE BIOGEL PI INDICATOR 8 (GLOVE) ×1
GLOVE SURG SS PI 7.5 STRL IVOR (GLOVE) ×2 IMPLANT
GOWN STRL REUS W/ TWL XL LVL3 (GOWN DISPOSABLE) ×1 IMPLANT
GOWN STRL REUS W/TWL XL LVL3 (GOWN DISPOSABLE) ×1
K-WIRE .035 (WIRE) ×2
KIT BASIN OR (CUSTOM PROCEDURE TRAY) ×2 IMPLANT
KWIRE .035 (WIRE) ×1 IMPLANT
KWIRE FIXATION L4 .035 (WIRE) ×1 IMPLANT
NEEDLE HYPO 22GX1.5 SAFETY (NEEDLE) ×2 IMPLANT
NEEDLE HYPO 25X1 1.5 SAFETY (NEEDLE) ×2 IMPLANT
NEEDLE KEITH (NEEDLE) ×2 IMPLANT
NS IRRIG 1000ML POUR BTL (IV SOLUTION) ×2 IMPLANT
PACK ORTHO EXTREMITY (CUSTOM PROCEDURE TRAY) ×2 IMPLANT
PAD CAST 4YDX4 CTTN HI CHSV (CAST SUPPLIES) ×2 IMPLANT
PADDING CAST ABS 4INX4YD NS (CAST SUPPLIES)
PADDING CAST ABS COTTON 4X4 ST (CAST SUPPLIES) IMPLANT
PADDING CAST COTTON 4X4 STRL (CAST SUPPLIES) ×2
PENCIL BUTTON HOLSTER BLD 10FT (ELECTRODE) ×2 IMPLANT
PLATE 9HOLE COMPRESSION 107MM (Plate) ×2 IMPLANT
PLATE LOCK COMP 10H 3.5 FOOT (Plate) ×2 IMPLANT
RUBBERBAND STERILE (MISCELLANEOUS) IMPLANT
SCREW 1.5X16MM (Screw) ×2 IMPLANT
SCREW 2.7X16MM (Screw) ×3 IMPLANT
SCREW CORTICAL 3.5MM  16MM (Screw) ×2 IMPLANT
SCREW CORTICAL 3.5MM 14MM (Screw) ×4 IMPLANT
SCREW CORTICAL 3.5MM 16MM (Screw) ×2 IMPLANT
SCREW LOCK 14X2.7X NS (Screw) ×3 IMPLANT
SCREW LOCK 16X2.7X (Screw) ×1 IMPLANT
SCREW LOCK CANC STAR 4X10 (Screw) ×4 IMPLANT
SCREW LOCK CANC STAR 4X12 (Screw) ×6 IMPLANT
SCREW LOCK CANC STAR 4X16 (Screw) ×2 IMPLANT
SCREW LOCKING 2.7X14 (Screw) ×3 IMPLANT
SCREW LOCKING 2.7X16 (Screw) ×1 IMPLANT
SCREW NLOCK CORT 2.7X16 NS (Screw) ×3 IMPLANT
SPLINT FIBERGLASS 3X35 (CAST SUPPLIES) ×2 IMPLANT
STAPLER VISISTAT 35W (STAPLE) ×2 IMPLANT
SUT FIBERWIRE 4-0 18 TAPR NDL (SUTURE) ×2
SUT VIC AB 2-0 CT1 27 (SUTURE) ×2
SUT VIC AB 2-0 CT1 TAPERPNT 27 (SUTURE) ×2 IMPLANT
SUT VIC AB 2-0 CT3 27 (SUTURE) ×2 IMPLANT
SUT VICRYL 4-0 PS2 18IN ABS (SUTURE) ×4 IMPLANT
SUT VICRYL RAPIDE 4/0 PS 2 (SUTURE) ×4 IMPLANT
SUTURE FIBERWR 4-0 18 TAPR NDL (SUTURE) ×1 IMPLANT
SYRINGE 10CC LL (SYRINGE) ×2 IMPLANT
TOWEL OR 17X24 6PK STRL BLUE (TOWEL DISPOSABLE) ×2 IMPLANT
TUBE CONNECTING 12X1/4 (SUCTIONS) ×2 IMPLANT
UNDERPAD 30X30 (UNDERPADS AND DIAPERS) ×2 IMPLANT
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
## 2016-05-18 NOTE — Discharge Instructions (Signed)
Discharge Instructions   You have a dressing with a plaster splint incorporated in it. Move your fingers as much as possible, making a full fist and fully opening the fist. Elevate your hand to reduce pain & swelling of the digits.  Ice over the operative site may be helpful to reduce pain & swelling.  DO NOT USE HEAT. Pain medicine has been prescribed for you.  Use your medicine as needed over the first 48 hours, and then you can begin to taper your use.  You may use Tylenol in place of your prescribed pain medication, but not IN ADDITION to it. Leave the dressing in place until you return to our office.  You may shower, but keep the bandage clean & dry.  You may drive a car when you are off of prescription pain medications and can safely control your vehicle with both hands. Our office will call you to arrange follow-up   Please call [phone] during normal business hours or [phone] after hours for any problems. Including the following:  - excessive redness of the incisions - drainage for more than 4 days - fever of more than 101.5 F  *Please note that pain medications will not be refilled after hours or on weekends.   Acute Compartment Syndrome Compartment syndrome is a painful condition that occurs when swelling and pressure build up in a body space (compartment) of the arms or legs. Groups of muscles, nerves, and blood vessels in the arms and legs are separated into various compartments. Each compartment is surrounded by tough layers of tissue called fascia. In compartment syndrome, pressure builds up within the layers of fascia and begins to push on the structures within that compartment.  In acute compartment syndrome, the pressure builds up suddenly, often as the result of an injury. This is a surgical emergency. When a muscle in the compartment moves, you may feel severe pain. If pressure continues to increase, it can block the flow of blood in the smallest blood vessels  (capillaries). Then, the nerves and muscles in the compartment cannot get enough oxygen and nutrients (substances needed for survival). They will start to die within 4-8 hours. That is why the pressure needs to be relieved immediately. Identifying the condition early and treating it quickly can prevent most problems. CAUSES  Various things can lead to compartment syndrome. Possible causes include:   Injury. Some injuries can cause swelling or bleeding in a compartment. This can lead to compartment syndrome. Injuries that may cause this problem include:  Broken bones, especially the long bones of the arms and legs.  Crushing injuries.  Penetrating injuries, such as a knife wound that punctures the skin and tissue underneath.  Badly bruised muscles.  Poisonous bites, such as a snake bite.  Severe burns.  Blocked blood flow. This could result from:  A cast or bandage that is too tight.  A surgical procedure. Blood flow sometimes has to be stopped for a while during a surgery, usually with a tourniquet.  Lying for too long in a position that restricts blood flow. This can happen in people who have nerve damage or if a person is unconscious for a long time.  Drugs used to build up muscles (anabolic steroids).  Drugs that keep the blood from forming clots (blood thinners). SIGNS AND SYMPTOMS  The most common symptom of compartment syndrome is pain. The pain may:   Get worse when moving or stretching the affected body part.  Be more severe than it should  be for an injury.  Come along with a feeling of tingling or burning.  Become worse when the area is pushed or squeezed.  Be unaffected by pain medicine. Other symptoms include:   A feeling of tightness or fullness in the affected area.   A loss of feeling.  Weakness in the area.  Loss of movement.  Skin becoming pale, tight, and shiny over the painful area.  DIAGNOSIS  Your health care provider may suspect the problem  based on how you describe the pain. The diagnosis is made by using a special device that measures the pressure in the affected area. Blood tests, X-rays, or an ultrasound exam may be done to help rule out other problems.  TREATMENT  Compartment syndrome is a surgical emergency. It should be treated very quickly.   First-aid treatment is given first. This may include:  Promptly treating an injury.  Loosening or removing any cast, bandage, or external wrap that may be causing pain.  Raising the painful arm or leg to the same level as the heart.  Giving oxygen.  Giving fluid through an IV access tube that is put into a vein in the hand or arm.  Surgery (fasciotomy) is needed to relieve the pressure and help prevent permanent damage. In this surgery, cuts (incisions) are made through the fascia to relieve the pressure in the compartment. This information is not intended to replace advice given to you by your health care provider. Make sure you discuss any questions you have with your health care provider. Document Released: 03/22/2009 Document Revised: 12/04/2012 Document Reviewed: 11/05/2012 [employer] Interactive Patient Education  2017 [employer]
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
## 2016-05-18 NOTE — Transfer of Care (Signed)
Immediate Anesthesia Transfer of Care Note  Patient: Misty DittoShannon Bates  Procedure(s) Performed: Procedure(s): OPEN REDUCTION INTERNAL FIXATION RIGHT FOREARM BOTH BONE FRACTURE, OPEN REDUCTION INTERNAL FIXATION RIGHT INDEX FINGER (Right)  Patient Location: PACU  Anesthesia Type:General  Level of Consciousness: awake, alert  and oriented  Airway & Oxygen Therapy: Patient Spontanous Breathing  Post-op Assessment: Report given to RN and Post -op Vital signs reviewed and stable  Post vital signs: Reviewed and stable  Last Vitals:  Vitals:   05/18/16 1845 05/18/16 2325  BP: 139/93 (!) 140/93  Pulse: 85 (!) 101  Resp:  11  Temp:  36.7 C    Last Pain:  Vitals:   05/18/16 1509  PainSc: 10-Worst pain ever         Complications: No apparent anesthesia complications
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
## 2016-05-18 NOTE — Anesthesia Preprocedure Evaluation (Signed)

## 2016-05-18 NOTE — Anesthesia Procedure Notes (Signed)
Procedure Name: Intubation Date/Time: 05/18/2016 7:58 PM Performed by: Molli HazardGORDON, Shemaiah Round M Pre-anesthesia Checklist: Patient identified, Emergency Drugs available, Suction available and Patient being monitored Patient Re-evaluated:Patient Re-evaluated prior to inductionOxygen Delivery Method: Circle system utilized Preoxygenation: Pre-oxygenation with 100% oxygen Intubation Type: IV induction and Rapid sequence Ventilation: Mask ventilation without difficulty Laryngoscope Size: Miller and 2 Grade View: Grade I Tube type: Oral Tube size: 7.5 mm Number of attempts: 1 Airway Equipment and Method: Stylet Placement Confirmation: ETT inserted through vocal cords under direct vision,  positive ETCO2 and breath sounds checked- equal and bilateral Secured at: 23 cm Tube secured with: Tape Dental Injury: Teeth and Oropharynx as per pre-operative assessment

## 2016-05-19 MED ORDER — CEFAZOLIN IN D5W 1 GM/50ML IV SOLN
1.0000 g | Freq: Three times a day (TID) | INTRAVENOUS | Status: AC
  Administered 2016-05-19 (×2): 1 g via INTRAVENOUS
  Filled 2016-05-19 (×2): qty 50

## 2016-05-19 MED ORDER — HYDROMORPHONE HCL 1 MG/ML IJ SOLN
INTRAMUSCULAR | Status: AC
  Filled 2016-05-19: qty 0.5

## 2016-05-19 MED ORDER — OXYCODONE HCL 5 MG PO TABS
5.0000 mg | ORAL_TABLET | ORAL | Status: DC | PRN
  Administered 2016-05-19: 10 mg via ORAL
  Filled 2016-05-19: qty 2

## 2016-05-19 MED ORDER — ONDANSETRON HCL 4 MG PO TABS: 4.0000 mg | ORAL_TABLET | Freq: Four times a day (QID) | ORAL | Status: DC | PRN

## 2016-05-19 MED ORDER — MELOXICAM 7.5 MG PO TABS
15.0000 mg | ORAL_TABLET | Freq: Every day | ORAL | Status: DC
  Administered 2016-05-19: 15 mg via ORAL
  Filled 2016-05-19: qty 2

## 2016-05-19 MED ORDER — ONDANSETRON HCL 4 MG/2ML IJ SOLN: 4.0000 mg | Freq: Four times a day (QID) | INTRAMUSCULAR | Status: DC | PRN

## 2016-05-19 MED ORDER — HYDROMORPHONE HCL 2 MG/ML IJ SOLN: 0.5000 mg | INTRAMUSCULAR | Status: DC | PRN

## 2016-05-19 NOTE — Progress Notes (Signed)
Discharge instructions, RXs and follow up appts explained and provided to patient verbalized understanding. Left floor via wheelchair accompanied by staff no c/o pain or shortness of breath at d/c.  Tion Tse Lynn, RN  
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
## 2016-05-19 NOTE — Care Management Obs Status (Signed)
MEDICARE OBSERVATION STATUS NOTIFICATION   Patient Details  Name: Misty Bates MRN: [id number] Date of Birth: 1970-06-14   Medicare Observation Status Notification Given:  Yes    Durenda GuthrieBrady, Hajra [place], RN 05/19/2016, 11:32 AM

## 2016-05-23 ENCOUNTER — Encounter (HOSPITAL_COMMUNITY): Payer: Self-pay | Admitting: Orthopedic Surgery

## 2016-05-24 NOTE — Anesthesia Postprocedure Evaluation (Addendum)
Anesthesia Post Note  Patient: Misty DittoShannon Bates  Procedure(s) Performed: Procedure(s) (LRB): OPEN REDUCTION INTERNAL FIXATION RIGHT FOREARM BOTH BONE FRACTURE, OPEN REDUCTION INTERNAL FIXATION RIGHT INDEX FINGER (Right)  Patient location during evaluation: PACU Anesthesia Type: General Level of consciousness: awake and alert Pain management: pain level controlled Vital Signs Assessment: post-procedure vital signs reviewed and stable Respiratory status: spontaneous breathing, nonlabored ventilation, respiratory function stable and patient connected to nasal cannula oxygen Cardiovascular status: blood pressure returned to baseline and stable Postop Assessment: no signs of nausea or vomiting Anesthetic complications: no       Last Vitals:  Vitals:   05/19/16 0039 05/19/16 0506  BP: 117/83 124/77  Pulse: 88 93  Resp: 12 12  Temp: 37.2 C 37.1 C    Last Pain:  Vitals:   05/19/16 1028  TempSrc:   PainSc: 2                  Karimah Winquist S

## 2016-07-10 ENCOUNTER — Encounter: Payer: 59 | Admitting: Gynecology

## 2016-07-10 DIAGNOSIS — Z0289 Encounter for other administrative examinations: Secondary | ICD-10-CM

## 2016-08-23 ENCOUNTER — Encounter: Payer: 59 | Admitting: Gynecology

## 2016-09-18 NOTE — Addendum Note (Signed)
Addendum  created 09/18/16 1051 by Oyuki Hogan, MD   Sign clinical note    

## 2017-02-05 ENCOUNTER — Ambulatory Visit (HOSPITAL_COMMUNITY)
Admission: EM | Admit: 2017-02-05 | Discharge: 2017-02-05 | Disposition: A | Discharge status: Home / Self Care | Payer: 59 | Attending: Family Medicine | Admitting: Family Medicine

## 2017-02-05 ENCOUNTER — Encounter (HOSPITAL_COMMUNITY): Payer: Self-pay | Admitting: Family Medicine

## 2017-02-05 DIAGNOSIS — B9789 Other viral agents as the cause of diseases classified elsewhere: Secondary | ICD-10-CM | POA: Diagnosis not present

## 2017-02-05 DIAGNOSIS — J069 Acute upper respiratory infection, unspecified: Secondary | ICD-10-CM | POA: Diagnosis not present

## 2017-02-05 MED ORDER — PREDNISONE 10 MG PO TABS: 10.0000 mg | ORAL_TABLET | Freq: Every day | ORAL | 0 refills | Status: DC

## 2017-02-05 NOTE — ED Provider Notes (Signed)
[facility]    CSN: [id number] Arrival date & time: 02/05/17  1417     History   Chief Complaint Chief Complaint  Patient presents with  . Cough    HPI Misty Bates is a 46 y.o. female.   Misty Bates presents with complaints of persistent cough. She became sick with ear pain, sore throat, nasal drainage and cough 2 weeks ago. Generally symptoms have improved but still with non productive cough which is causing upper back and chest muscle soreness. Denies shortness of breath and fevers. Has mild nausea. Feels that she has mucus but is unable to produce it. 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 She has been taking alkaselxer cold, decongestants and robitussin for her symptoms. She does not smoke.       Past Medical History:  Diagnosis Date  . 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 Urinary incontinence    Stress incontinence    Patient Active Problem List   Diagnosis Date Noted  . Right forearm fracture 05/18/2016    Past Surgical History:  Procedure Laterality Date  . COLPOSCOPY    . FOOT SURGERY    . LASER ABLATION OF THE CERVIX    . OPEN REDUCTION INTERNAL FIXATION (ORIF) DISTAL RADIAL FRACTURE Right 05/18/2016   Procedure: OPEN REDUCTION INTERNAL FIXATION RIGHT FOREARM BOTH BONE FRACTURE, OPEN REDUCTION INTERNAL FIXATION RIGHT INDEX FINGER;  Surgeon: Mack Hook, MD;  Location: [facility] OR;  Service: Orthopedics;  Laterality: Right;  . TUBAL LIGATION      OB History    Gravida Para Term Preterm AB Living   5 4 4   1 4    SAB TAB Ectopic Multiple Live Births   1               Home Medications    Prior to Admission medications   Medication Sig Start Date End Date Taking? Authorizing Provider  acetaminophen (TYLENOL) 325 MG tablet Take 2 tablets (650 mg total) by mouth every 6 (six) hours as needed for mild pain or moderate pain. 05/18/16   Mack Hook, MD  Multiple Vitamins-Calcium  (ONE-A-DAY WOMENS PO) Take 1 tablet by mouth daily.    [provider]  oxyCODONE (ROXICODONE) 5 MG immediate release tablet Take 1-2 tablets (5-10 mg total) by mouth every 4 (four) hours as needed for severe pain. 05/18/16   Mack Hook, MD  predniSONE (DELTASONE) 10 MG tablet Take 1 tablet (10 mg total) by mouth daily with breakfast. Take 40mg  for 2 days 20mg  for 2 days 10 mg for 1 day 02/05/17   Georgetta Haber, NP  sulfamethoxazole-trimethoprim (BACTRIM DS,SEPTRA DS) 800-160 MG tablet Take 1 tablet by mouth 2 (two) times daily. Patient not taking: Reported on 05/19/2016 08/10/15   Fontaine, Nadyne Coombes, MD  terconazole (TERAZOL 3) 0.8 % vaginal cream Place 1 applicator vaginally at bedtime. For 3 nights Patient not taking: Reported on 05/19/2016 08/10/15   Fontaine, Nadyne Coombes, MD    Family History Family History  Problem Relation Age of Onset  . Diabetes Maternal Aunt   . Hypertension Maternal Aunt   . Diabetes Paternal Grandmother   . Hypertension Paternal Grandmother     Social History Social History  Substance Use Topics  . Smoking status: Never Smoker  . Smokeless tobacco: Current User    Types: Snuff     Comment: trying to quit  . Alcohol use 0.0 oz/week  Comment: occassionally     Allergies   Patient has no known allergies.   Review of Systems Review of Systems  Constitutional: Negative.   HENT: Negative.   Eyes: Negative.   Respiratory: Positive for cough. Negative for chest tightness, shortness of breath and wheezing.   Cardiovascular: Negative.   Gastrointestinal: Positive for nausea. Negative for constipation, diarrhea and vomiting.  Genitourinary: Negative.   Musculoskeletal: Negative.   Neurological: Negative.      Physical Exam Triage Vital Signs ED Triage Vitals  Enc Vitals Group     BP 02/05/17 1426 102/67     Pulse Rate 02/05/17 1426 86     Resp 02/05/17 1426 16     Temp 02/05/17 1426 98.4 F (36.9 C)     Temp src --      SpO2  02/05/17 1426 97 %     Weight --      Height --      Head Circumference --      Peak Flow --      Pain Score 02/05/17 1425 5     Pain Loc --      Pain Edu? --      Excl. in GC? --    No data found.   Updated Vital Signs BP 102/67 (BP Location: Left Arm)   Pulse 86   Temp 98.4 F (36.9 C)   Resp 16   SpO2 97%   Visual Acuity Right Eye Distance:   Left Eye Distance:   Bilateral Distance:    Right Eye Near:   Left Eye Near:    Bilateral Near:     Physical Exam  Constitutional: She is oriented to person, place, and time. She appears well-developed and well-nourished. No distress.  HENT:  Head: Normocephalic.  Right Ear: Tympanic membrane, external ear and ear canal normal.  Left Ear: Tympanic membrane, external ear and ear canal normal.  Nose: Nose normal. Right sinus exhibits no maxillary sinus tenderness and no frontal sinus tenderness. Left sinus exhibits no maxillary sinus tenderness and no frontal sinus tenderness.  Mouth/Throat: Uvula is midline, oropharynx is clear and moist and mucous membranes are normal.  Cardiovascular: Normal rate, regular rhythm and normal heart sounds.   Pulmonary/Chest: Effort normal and breath sounds normal. No respiratory distress.  Strong dry cough noted  Neurological: She is alert and oriented to person, place, and time.  Skin: Skin is warm and dry.     UC Treatments / Results  Labs (all labs ordered are listed, but only abnormal results are displayed) Labs Reviewed - No data to display  EKG  EKG Interpretation None       Radiology No results found.  Procedures Procedures (including critical care time)  Medications Ordered in UC Medications - No data to display   Initial Impression / Assessment and Plan / UC Course  I have reviewed the triage vital signs and the nursing notes.  Pertinent labs & imaging results that were available during my care of the patient were reviewed by me and considered in my medical  decision making (see chart for details).     Without acute findings on exam, lungs clear. Without shortness of breath, non toxic non distressed, vitals stable. Course of steroids for persistent cough. Push fluids. May continue with OTC treatments as needed. Patient verbalized understanding and agreeable to plan.    Final Clinical Impressions(s) / UC Diagnoses   Final diagnoses:  Viral URI with cough    New Prescriptions New Prescriptions  PREDNISONE (DELTASONE) 10 MG TABLET    Take 1 tablet (10 mg total) by mouth daily with breakfast. Take 40mg  for 2 days 20mg  for 2 days 10 mg for 1 day     Controlled Substance Prescriptions [place] Controlled Substance Registry consulted? Not Applicable   Georgetta HaberBurky, Natalie B, NP 02/05/17 1454

## 2017-02-05 NOTE — ED Triage Notes (Signed)
Pt here for cough x 2 weeks with phlegm. Reports that she feels very congested. Taking OTC meds

## 2017-05-18 ENCOUNTER — Encounter: Payer: Self-pay | Admitting: Family Medicine

## 2017-05-18 ENCOUNTER — Other Ambulatory Visit: Payer: Self-pay

## 2017-05-18 ENCOUNTER — Ambulatory Visit (INDEPENDENT_AMBULATORY_CARE_PROVIDER_SITE_OTHER): Payer: 59 | Admitting: Family Medicine

## 2017-05-18 VITALS — BP 120/82 | HR 99 | Temp 98.5°F | Resp 16 | Ht 69.5 in | Wt 222.4 lb

## 2017-05-18 DIAGNOSIS — N938 Other specified abnormal uterine and vaginal bleeding: Secondary | ICD-10-CM | POA: Diagnosis not present

## 2017-05-18 DIAGNOSIS — R740 Nonspecific elevation of levels of transaminase and lactic acid dehydrogenase [LDH]: Secondary | ICD-10-CM | POA: Diagnosis not present

## 2017-05-18 DIAGNOSIS — N898 Other specified noninflammatory disorders of vagina: Secondary | ICD-10-CM | POA: Diagnosis not present

## 2017-05-18 DIAGNOSIS — H5713 Ocular pain, bilateral: Secondary | ICD-10-CM | POA: Diagnosis not present

## 2017-05-18 DIAGNOSIS — R102 Pelvic and perineal pain unspecified side: Secondary | ICD-10-CM

## 2017-05-18 DIAGNOSIS — R7401 Elevation of levels of liver transaminase levels: Secondary | ICD-10-CM

## 2017-05-18 DIAGNOSIS — Z124 Encounter for screening for malignant neoplasm of cervix: Secondary | ICD-10-CM | POA: Diagnosis not present

## 2017-05-18 DIAGNOSIS — Z113 Encounter for screening for infections with a predominantly sexual mode of transmission: Secondary | ICD-10-CM

## 2017-05-18 LAB — POCT URINALYSIS DIP (MANUAL ENTRY)
Bilirubin, UA: NEGATIVE
Glucose, UA: NEGATIVE mg/dL
Leukocytes, UA: NEGATIVE
NITRITE UA: NEGATIVE
PROTEIN UA: NEGATIVE mg/dL
RBC UA: NEGATIVE
SPEC GRAV UA: 1.02 (ref 1.010–1.025)
UROBILINOGEN UA: 0.2 U/dL
pH, UA: 7 (ref 5.0–8.0)

## 2017-05-18 LAB — POCT WET + KOH PREP
Trich by wet prep: ABSENT
Yeast by KOH: ABSENT
Yeast by wet prep: ABSENT

## 2017-05-18 LAB — POC MICROSCOPIC URINALYSIS (UMFC): MUCUS RE: ABSENT

## 2017-05-18 MED ORDER — FLUCONAZOLE 150 MG PO TABS: 150.0000 mg | ORAL_TABLET | Freq: Once | ORAL | 0 refills | Status: AC

## 2017-05-18 MED ORDER — CYCLOSPORINE 0.05 % OP EMUL: 1.0000 [drp] | Freq: Two times a day (BID) | OPHTHALMIC | 1 refills | Status: DC

## 2017-05-18 NOTE — Progress Notes (Addendum)
Subjective:    Patient ID: Misty Bates, female    DOB: 26-Nov-1970, 47 y.o.   MRN: [id number] Chief Complaint  Patient presents with  . estab care  . eye    "feels like pins sticking in eyes" x 3 months  . urine    "smells like ammonia" smells better with taking Probiotic x 3 mos, lower abd pain x 1 month    HPI Has 3 mos of pain in lower abdomen but she only feels a pain in the "ovary region" at the end of urination so she relaxes her valsalva and just finish urinating.  Stays constipated at baseline so started probiotics and fiber.  Doing benefiber 2 "on-the-go" things a day to keep her regular for the past 2 months which has been working for her.  No pain with BM. Urine smells has gone away since started the probiotics.  She had a yeast infection and the otc medication didn't work.  Her last pap smear was about 3 years ago.  She doesn't like to go because she doesn't like having her blood drawn.  She has had an abnormal pap smear 20 years ago. She had a colposcopy after. Then had LEEP maybe.  Has high bilirubin at baseline - has been tested in normal.   Feels like she is starting menopause because she didn't have a period this month.  No bloating.  Is sexually active and has had a BTL.   Her uterus is straight back to her right.  No f/c - she feels like she is coming down with something.    Past Medical History:  Diagnosis Date  . 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 Urinary incontinence    Stress incontinence   Past Surgical History:  Procedure Laterality Date  . COLPOSCOPY    . FOOT SURGERY    . LASER ABLATION OF THE CERVIX    . OPEN REDUCTION INTERNAL FIXATION (ORIF) DISTAL RADIAL FRACTURE Right 05/18/2016   Procedure: OPEN REDUCTION INTERNAL FIXATION RIGHT FOREARM BOTH BONE FRACTURE, OPEN REDUCTION INTERNAL FIXATION RIGHT INDEX FINGER;  Surgeon: Mack Hook, MD;  Location: [facility] OR;  Service: Orthopedics;  Laterality: Right;  . TUBAL LIGATION     Current Outpatient  Medications on File Prior to Visit  Medication Sig Dispense Refill  . Multiple Vitamins-Calcium (ONE-A-DAY WOMENS PO) Take 1 tablet by mouth daily.    Marland Kitchen oxyCODONE (ROXICODONE) 5 MG immediate release tablet Take 1-2 tablets (5-10 mg total) by mouth every 4 (four) hours as needed for severe pain. 60 tablet 0  . acetaminophen (TYLENOL) 325 MG tablet Take 2 tablets (650 mg total) by mouth every 6 (six) hours as needed for mild pain or moderate pain. (Patient not taking: Reported on 05/18/2017)    . [DISCONTINUED] ipratropium (ATROVENT) 0.06 % nasal spray Place 2 sprays into both nostrils 4 (four) times daily. 15 mL 1   No current facility-administered medications on file prior to visit.    No Known Allergies Family History  Problem Relation Age of Onset  . Diabetes Maternal Aunt   . Hypertension Maternal Aunt   . Diabetes Paternal Grandmother   . Hypertension Paternal Grandmother   . Diabetes Maternal Grandmother    Social History   Socioeconomic History  . Marital status: Single    Spouse name: None  . Number of children: None  . Years of education: None  . Highest education level: None  Social Needs  . Financial resource strain: None  . Food insecurity -  worry: None  . Food insecurity - inability: None  . Transportation needs - medical: None  . Transportation needs - non-medical: None  Occupational History  . None  Tobacco Use  . Smoking status: Never Smoker  . Smokeless tobacco: Current User    Types: Snuff  . Tobacco comment: trying to quit  Substance and Sexual Activity  . Alcohol use: Yes    Alcohol/week: 2.4 oz    Types: 4 Standard drinks or equivalent per week    Comment: occassionally  . Drug use: No  . Sexual activity: Yes    Partners: Male    Birth control/protection: Surgical    Comment: Tubal lig-1st intercourse 16 yo-5 partners  Other Topics Concern  . None  Social History Narrative  . None   Depression screen [facility] 2/9 05/18/2017  Decreased Interest 0    Down, Depressed, Hopeless 0  PHQ - 2 Score 0    Review of Systems See hpi    Objective:   Physical Exam  Constitutional: She is oriented to person, place, and time. She appears well-developed and well-nourished. No distress.  HENT:  Head: Normocephalic and atraumatic.  Eyes: Conjunctivae, EOM and lids are normal. Pupils are equal, round, and reactive to light. Right eye exhibits no chemosis and no discharge. Left eye exhibits no chemosis and no discharge. No scleral icterus.  Cardiovascular: Normal rate, regular rhythm, normal heart sounds and intact distal pulses.  Pulmonary/Chest: Effort normal and breath sounds normal.  Abdominal: Soft. Bowel sounds are normal. She exhibits no distension. There is no tenderness. There is no rebound and no guarding.  Genitourinary: Pelvic exam was performed with patient supine. There is no rash, tenderness or lesion on the right labia. There is no rash, tenderness or lesion on the left labia. Uterus is enlarged, fixed and tender. Cervix exhibits no motion tenderness and no friability. Right adnexum displays tenderness and fullness. Right adnexum displays no mass. Left adnexum displays no mass, no tenderness and no fullness. No erythema or tenderness in the vagina. Vaginal discharge found.  Genitourinary Comments: Small amount of normal menstrual blood began coming from cervical os after manipulation with pap brush.   Lymphadenopathy:       Right: No inguinal adenopathy present.       Left: No inguinal adenopathy present.  Neurological: She is alert and oriented to person, place, and time.  Skin: Skin is warm and dry. She is not diaphoretic.  Psychiatric: She has a normal mood and affect. Her behavior is normal.     Visual Acuity Screening   Right eye Left eye Both eyes  Without correction: 20/40 20/40 20/30   With correction:        BP 120/82   Pulse 99   Temp 98.5 F (36.9 C)   Resp 16   Ht 5' 9.5" (1.765 m)   Wt 222 lb 6.4 oz (100.9 kg)    LMP 04/09/2017   SpO2 98%   BMI 32.37 kg/m      Results for orders placed or performed in visit on 05/18/17  POCT urinalysis dipstick  Result Value Ref Range   Color, UA yellow yellow   Clarity, UA cloudy (A) clear   Glucose, UA negative negative mg/dL   Bilirubin, UA negative negative   Ketones, POC UA trace (5) (A) negative mg/dL   Spec Grav, UA 1.610 9.604 - 1.025   Blood, UA negative negative   pH, UA 7.0 5.0 - 8.0   Protein Ur, POC negative negative mg/dL  Urobilinogen, UA 0.2 0.2 or 1.0 E.U./dL   Nitrite, UA Negative Negative   Leukocytes, UA Negative Negative  POCT Microscopic Urinalysis ([facility])  Result Value Ref Range   WBC,UR,HPF,POC None None WBC/hpf   RBC,UR,HPF,POC None None RBC/hpf   Bacteria Few (A) None, Too numerous to count   Mucus Absent Absent   Epithelial Cells, UR Per Microscopy Many (A) None, Too numerous to count cells/hpf  POCT Wet + KOH Prep  Result Value Ref Range   Yeast by KOH Absent Absent   Yeast by wet prep Absent Absent   WBC by wet prep None (A) Few   Clue Cells Wet Prep HPF POC None None   Trich by wet prep Absent Absent   Bacteria Wet Prep HPF POC Few Few   Epithelial Cells By [employer] Pref ([facility]) None None, Few, Too numerous to count   RBC,UR,HPF,POC None None RBC/hpf    Assessment & Plan:   1. Pelvic pain in female - needs [place] to r/o ovarian mass or uterine enlargment  2. Eye pain, bilateral - sxs seem c/w dry eye - try restasis or artificial tears like rephresh, refer to ophtho for further eval of sxs  3. Vaginal discharge - wet prep and urine nml - try diflucan but if sxs persist could consider trial of vaginal metrogel  4. Screening for cervical cancer - pap today - pt has appt with gyn in May but since she has some concerning pelvic sxs if we can get basic testing done now, hopefully that visit will be more productive  5. Screening for STD (sexually transmitted disease)   6. Dysfunctional uterine bleeding - pt thinks she is  perimenopausal - check labs  7.      H/o benign elevated bilirubin - pt states always elevated and other doctors freak out - want to repeat labs or get [place] and she doesn't want to do any of that - she has had it worked up and does not need further eval.  Does not like having her labs drawn and being poked/prodded which is why she just stopped receiving any medical care for the past sev yrs.   Orders Placed This Encounter  Procedures  . US Pelvis Complete    Standing Status:   Future    Standing Expiration Date:   07/17/2018    Order Specific Question:   Reason for Exam (SYMPTOM  OR DIAGNOSIS REQUIRED)    Answer:   enlarged uterus, uterine and adenexal pain, r/o ovarian mass    Order Specific Question:   Preferred imaging location?    Answer:   [facility]  . US Transvaginal Non-OB    Standing Status:   Future    Standing Expiration Date:   07/17/2018    Order Specific Question:   Reason for Exam (SYMPTOM  OR DIAGNOSIS REQUIRED)    Answer:   enlarged uterus, uterine and adenexal pain, r/o ovarian mass    Order Specific Question:   Preferred imaging location?    Answer:   [facility]  . Comprehensive metabolic panel  . FSH/LH  . RPR  . HCV Ab w/Rflx to Verification  . HIV antibody  . TSH  . CBC with Differential/Platelet  . Ambulatory referral to Ophthalmology    Referral Priority:   Routine    Referral Type:   Consultation    Referral Reason:   Specialty Services Required    Requested Specialty:   Ophthalmology    Number of Visits Requested:   1  .  POCT urinalysis dipstick  . POCT Microscopic Urinalysis ([facility])  . POCT Wet + KOH Prep    Meds ordered this encounter  Medications  . cycloSPORINE (RESTASIS) 0.05 % ophthalmic emulsion    Sig: Place 1 drop into both eyes 2 (two) times daily.    Dispense:  0.4 mL    Refill:  1    Norberto SorensonEva Shaw, M.D.  Primary Care at [facility]  [place] [address] [place], [place] [address] [phone] phone [phone]) [phone] fax  05/18/17  4:09 PM

## 2017-05-18 NOTE — Patient Instructions (Addendum)
IF you received an x-ray today, you will receive an invoice from [facility]. Please contact [facility] at [phone] with questions or concerns regarding your invoice.   IF you received labwork today, you will receive an invoice from [facility]. Please contact [facility] at [phone] with questions or concerns regarding your invoice.   Our billing staff will not be able to assist you with questions regarding bills from these companies.  You will be contacted with the lab results as soon as they are available. The fastest way to get your results is to activate your My Chart account. Instructions are located on the last page of this paperwork. If you have not heard from [place] regarding the results in 2 weeks, please contact this office.     Dry Eye Dry eye, also called keratoconjunctivitis sicca, is dryness of the membranes surrounding the eye. It happens when there are not enough healthy, natural tears in the eyes. The eyes must remain moist at all times. A small amount of tears is constantly produced by the tear glands (lacrimal glands). These glands are located under the outside part of the upper eyelids. Dryness of the eyes can be a symptom of a variety of conditions, such as rheumatoid arthritis, lupus, or Sjgren syndrome. Dry eye may be mild to severe. What are the causes? This condition may be caused by:  Not making enough tears (aqueous tear-deficient dry eyes).  Tears evaporating from the eye too quickly (evaporative dry eyes). This is when there is an abnormality in the quality of your tears. This abnormality causes your tears to evaporate so quickly that the eye cannot be kept moist.  What increases the risk? This condition is more likely to happen in:  Women, especially those who have gone through menopause.  People in dry climates.  People in dusty or smoky areas.  People who take certain medicines, such as: ? Anti-allergy medicines  (antihistamines). ? Blood pressure medicines (antihypertensives). ? Birth control pills (oral contraceptives). ? Laxatives. ? Tranquilizers.  What are the signs or symptoms? Symptoms in the eyes may include:  Irritation.  Itchiness.  Redness.  Burning.  Inflammation of the eyelids.  Feeling as though something is stuck in the eye.  Light sensitivity.  Increased sensitivity and discomfort when wearing contact lenses, if this applies.  Vision that varies throughout the day.  Occasional excessive tearing.  How is this diagnosed? This condition is diagnosed based on your symptoms, your medical history, and an eye exam. Your health care provider may look at your eye using a microscope and may put dyes in your eye to check the health of the surface of your eye. You may have a tests, such as a test to evaluate your tear production (Schirmer test). During this test, a small strip of special paper is gently pressed into the inner corner of your eye. Your tear production is measured by how much of the paper is moistened by your tears during a set amount of time. You may be referred to a health care provider who specializes in eyes and eyesight (ophthalmologist). How is this treated? This condition is often treated at home. Your health care provider may recommend eye drops, which are also called artificial tears. If your condition is severe, treatment may include:  Prescription eye drops.  Over-the-counter or prescription ointments to moisten your eyes.  Minor surgery to block tears from going into your nose.  Medicines to reduce inflammation of the eyelids.  Follow these instructions  at home:  Take, use, or apply over-the-counter and prescription medicines only as told by your health care provider. This includes eye drops.  If directed, apply a warm compress to your eyes to help reduce inflammation. Place a towel over your eyes and gently press the warm compress over your eyes  for about 5 minutes, or as long as told by your health care provider.  If possible, avoid dry, drafty environments.  Use a humidifier at home to increase moisture in the air.  If you wear contact lenses, remove them regularly to give your eyes a break. Always remove contacts before sleeping.  Keep all follow-up visits as told by your health care provider. This is important. This includes yearly eye exams and vision tests. Contact a health care provider if:  You have eye pain.  You have pus-like fluid coming from your eye.  Your symptoms get worse or do not improve with treatment. Get help right away if:  Your vision suddenly changes. This information is not intended to replace advice given to you by your health care provider. Make sure you discuss any questions you have with your health care provider. Document Released: 02/19/2004 Document Revised: 09/09/2015 Document Reviewed: 01/27/2015 Elsevier Interactive Patient Education  2018 [employer]. How to Use Eye Drops and Eye Ointments How to apply eye drops Follow these steps when applying eye drops: 1. Wash your hands. 2. Tilt your head back. 3. Put a finger under your eye and use it to gently pull your lower lid downward. Keep that finger in place. 4. Using your other hand, hold the dropper between your thumb and index finger. 5. Position the dropper just over the edge of the lower lid. Hold it as close to your eye as you can without touching the dropper to your eye. 6. Steady your hand. One way to do this is to lean your index finger against your brow. 7. Look up. 8. Slowly and gently squeeze one drop of medicine into your eye. 9. Close your eye. 10. Place a finger between your lower eyelid and your nose. Press gently for 2 minutes. This increases the amount of time that the medicine is exposed to the eye. It also reduces side effects that can develop if the drop gets into the bloodstream through the nose.  How to apply eye  ointments Follow these steps when applying eye ointments: 1. Wash your hands. 2. Put a finger under your eye and use it to gently pull your lower lid downward. Keep that finger in place. 3. Using your other hand, place the tip of the tube between your thumb and index finger with the remaining fingers braced against your cheek or nose. 4. Hold the tube just over the edge of your lower lid without touching the tube to your lid or eyeball. 5. Look up. 6. Line the inner part of your lower lid with ointment. 7. Gently pull up on your upper lid and look down. This will force the ointment to spread over the surface of the eye. 8. Release the upper lid. 9. If you can, close your eyes for 1-2 minutes.  Do not rub your eyes. If you applied the ointment correctly, your vision will be blurry for a few minutes. This is normal. Additional information  Make sure to use the eye drops or ointment as told by your health care provider.  If you have been told to use both eye drops and an eye ointment, apply the eye drops first, then  wait 3-4 minutes before you apply the ointment.  Try not to touch the tip of the dropper or tube to your eye. A dropper or tube that has touched the eye can become contaminated. This information is not intended to replace advice given to you by your health care provider. Make sure you discuss any questions you have with your health care provider. Document Released: 07/10/2000 Document Revised: 09/02/2015 Document Reviewed: 03/30/2014 Elsevier Interactive Patient Education  2018 [employer].  Artificial Tears eye solution What is this medicine? ARTIFICIAL TEARS (ahr tuh FISH uhl teerz) eye solution soothes irritation and discomfort caused by dry eyes. This medicine may be used for other purposes; ask your health care provider or pharmacist if you have questions. COMMON BRAND NAME(S): Akwa Tears, Akwa Tears Renewed, Artificial Tears, Bion Tears, Blink Tears, Clear eyes, Clear  eyes Outdoor Dry Eye Protection, FreshKote, GenTeal Mild, GenTeal Moderate, GenTeal PF, Gonak, Goniosoft, Hypo Tears, Isopto Tears, LiquiTears, Lubricating Plus, Moisture Eyes, Moisture Eyes Preservative Free, Natural Balance Tears, Nature's Tears, Opti-Free, Puralube Tears, Refresh, Refresh Celluvisc, Refresh Contacts Comfort, Refresh Endura, Refresh Optive, Refresh Optive Sensitive, Refresh Plus, Refresh Tears, Retaine CMC, Systane Balance, Teargen, Tears Naturale Forte, Tears Naturale II, Tears Renewed, TheraTears, Visine Advanced, Visine Dry Eye Relief, Visine Pure Tears, Visine Tears, Visine Tired Eye Relief, Viva What should I tell my health care provider before I take this medicine? -change in vision -eye infection or trauma -wear contact lenses -an unusual or allergic reaction to artificial tears, other medicines, foods, dyes, or preservatives -pregnant or trying to get pregnant -breast-feeding How should I use this medicine? This medicine is only for use in the eye. Do not take by mouth. Follow the directions on the label. Wash hands before and after use. Tilt the head back slightly and pull down the lower eyelid with your index finger to form a pouch. Try not to touch the tip of the dropper to your eye, fingertips, or any other surface. Squeeze the prescribed number of drops (usually one or two drops) into the pouch. Close the eye gently for a few moments to allow the drops to be in contact with the eye. Use your medicine at regular intervals. Do not use your medicine more often than directed. Talk to your pediatrician regarding the use of this medicine in children. While this medicine may be used in children as young as 6 years for selected conditions, precautions do apply. Overdosage: If you think you have taken too much of this medicine contact a poison control center or emergency room at once. NOTE: This medicine is only for you. Do not share this medicine with others. What if I miss a  dose? If you miss a dose, use it as soon as you can. If it is almost time for your next dose, use only that dose. Do not use double or extra doses. What may interact with this medicine? Interactions are not expected. If you are using other eye drops with this medicine, separate the application of the different eye drops by roughly 5 minutes. This ensures that the eye drops do not interfere with each other. If you are using both eye drops and an eye ointment, use the eye drops 10 minutes before the eye ointment so that the eye ointment does not interfere with the action of the drops. This list may not describe all possible interactions. Give your health care provider a list of all the medicines, herbs, non-prescription drugs, or dietary supplements you use. Also tell  them if you smoke, drink alcohol, or use illegal drugs. Some items may interact with your medicine. What should I watch for while using this medicine? If you experience eye pain, changes in vision, continued redness or irritation of the eye, or if your eye condition gets worse or lasts longer than 72 hours, discontinue use and consult your health care professional. To avoid contamination of this product, do not touch the tip of the container to any surface. Do not share this medicine with others. If the product changes color or becomes cloudy, do not use. If you wear contact lenses, you should remove them before putting the drops in your eyes. Wait at least 15 minutes after putting the drops in your eyes before putting your contact lenses back in. What side effects may I notice from receiving this medicine? Side effects that you should report to your doctor or health care professional as soon as possible: -allergic reactions like skin rash, itching or hives, swelling of the face, lips, or tongue -change in vision -eye irritation or redness that gets worse or lasts more than 72 hours -eye pain Side effects that usually do not require  medical attention (report to your doctor or health care professional if they continue or are bothersome): -temporary stinging or blurred vision when applying the eye drops This list may not describe all possible side effects. Call your doctor for medical advice about side effects. You may report side effects to FDA at 1-800-FDA-1088. Where should I keep my medicine? Keep out of the reach of children. Store at room temperature between 15 and 30 degrees C (59 and 86 degrees F). Do not freeze. Throw away any unused medicine after the expiration date. Once the product is opened, most experts recommend discarding the product after 30 days. NOTE: This sheet is a summary. It may not cover all possible information. If you have questions about this medicine, talk to your doctor, pharmacist, or health care provider.  2018 Elsevier/Gold Standard (2007-10-04 14:24:03)

## 2017-05-19 LAB — CBC WITH DIFFERENTIAL/PLATELET
BASOS ABS: 0 10*3/uL (ref 0.0–0.2)
Basos: 0 %
EOS (ABSOLUTE): 0.1 10*3/uL (ref 0.0–0.4)
Eos: 2 %
HEMOGLOBIN: 12.6 g/dL (ref 11.1–15.9)
Hematocrit: 39.4 % (ref 34.0–46.6)
Immature Grans (Abs): 0 10*3/uL (ref 0.0–0.1)
Immature Granulocytes: 0 %
LYMPHS ABS: 1.7 10*3/uL (ref 0.7–3.1)
Lymphs: 32 %
MCH: 27.4 pg (ref 26.6–33.0)
MCHC: 32 g/dL (ref 31.5–35.7)
MCV: 86 fL (ref 79–97)
MONOS ABS: 0.6 10*3/uL (ref 0.1–0.9)
Monocytes: 11 %
NEUTROS ABS: 2.9 10*3/uL (ref 1.4–7.0)
Neutrophils: 55 %
Platelets: 292 10*3/uL (ref 150–379)
RBC: 4.6 x10E6/uL (ref 3.77–5.28)
RDW: 17.6 % — AB (ref 12.3–15.4)
WBC: 5.3 10*3/uL (ref 3.4–10.8)

## 2017-05-19 LAB — RPR: RPR: NONREACTIVE

## 2017-05-19 LAB — COMPREHENSIVE METABOLIC PANEL
A/G RATIO: 1.6 (ref 1.2–2.2)
ALT: 231 IU/L — AB (ref 0–32)
AST: 164 IU/L — AB (ref 0–40)
Albumin: 4.5 g/dL (ref 3.5–5.5)
Alkaline Phosphatase: 166 IU/L — ABNORMAL HIGH (ref 39–117)
BUN/Creatinine Ratio: 13 (ref 9–23)
BUN: 10 mg/dL (ref 6–24)
Bilirubin Total: 0.4 mg/dL (ref 0.0–1.2)
CALCIUM: 9 mg/dL (ref 8.7–10.2)
CO2: 22 mmol/L (ref 20–29)
CREATININE: 0.75 mg/dL (ref 0.57–1.00)
Chloride: 100 mmol/L (ref 96–106)
GFR calc Af Amer: 110 mL/min/{1.73_m2} (ref >59)
GFR, EST NON AFRICAN AMERICAN: 95 mL/min/{1.73_m2} (ref >59)
Globulin, Total: 2.9 g/dL (ref 1.5–4.5)
Glucose: 84 mg/dL (ref 65–99)
Potassium: 4.2 mmol/L (ref 3.5–5.2)
Sodium: 139 mmol/L (ref 134–144)
TOTAL PROTEIN: 7.4 g/dL (ref 6.0–8.5)

## 2017-05-19 LAB — FSH/LH
FSH: 8.4 m[IU]/mL
LH: 9.3 m[IU]/mL

## 2017-05-19 LAB — HIV ANTIBODY (ROUTINE TESTING W REFLEX): HIV SCREEN 4TH GENERATION: NONREACTIVE

## 2017-05-19 LAB — TSH: TSH: 1.47 u[IU]/mL (ref 0.450–4.500)

## 2017-05-19 LAB — HCV AB W/RFLX TO VERIFICATION

## 2017-05-19 LAB — HCV INTERPRETATION

## 2017-05-20 DIAGNOSIS — R740 Nonspecific elevation of levels of transaminase and lactic acid dehydrogenase [LDH]: Secondary | ICD-10-CM

## 2017-05-20 DIAGNOSIS — R7401 Elevation of levels of liver transaminase levels: Secondary | ICD-10-CM | POA: Insufficient documentation

## 2017-05-22 LAB — PAP IG, CT-NG NAA, HPV HIGH-RISK
CHLAMYDIA, NUC. ACID AMP: NEGATIVE
GONOCOCCUS BY NUCLEIC ACID AMP: NEGATIVE
HPV, high-risk: NEGATIVE
PAP Smear Comment: 0

## 2017-05-23 NOTE — Progress Notes (Signed)
Letter sent. VM is full

## 2017-05-28 ENCOUNTER — Telehealth: Payer: Self-pay | Admitting: Family Medicine

## 2017-05-28 NOTE — Telephone Encounter (Signed)
Pt given results per notes of Dr. Clelia CroftShaw on 05/20/17, patient verbalized understanding. She said "I know why my liver tests were elevated because I was drinking the weekend before. I have my ultrasound on Thursday." Follow up appointment per Dr. Clelia CroftShaw is Friday, 06/01/17 at 1000. I advised patient to remain fasting in case lab work can be done at this visit, she verbalized understanding. Unable to document in result note due to result [facility] being [facility].

## 2017-05-28 NOTE — Telephone Encounter (Signed)
Noted! Thank you

## 2017-05-31 ENCOUNTER — Ambulatory Visit
Admission: RE | Admit: 2017-05-31 | Discharge: 2017-05-31 | Disposition: A | Discharge status: Home / Self Care | Payer: 59 | Source: Ambulatory Visit | Attending: Family Medicine | Admitting: Family Medicine

## 2017-05-31 ENCOUNTER — Telehealth: Payer: Self-pay | Admitting: Family Medicine

## 2017-05-31 DIAGNOSIS — R7401 Elevation of levels of liver transaminase levels: Secondary | ICD-10-CM

## 2017-05-31 DIAGNOSIS — R740 Nonspecific elevation of levels of transaminase and lactic acid dehydrogenase [LDH]: Principal | ICD-10-CM

## 2017-05-31 NOTE — Telephone Encounter (Signed)
Thank you Rose. That was perfect.

## 2017-05-31 NOTE — Telephone Encounter (Signed)
Spoke to pt - advised to keep appt and be fasting for repeat labs. Pt verbalized understanding

## 2017-05-31 NOTE — Telephone Encounter (Signed)
Copied from CRM [phone]. Topic: Quick Communication - See Telephone Encounter >> May 31, 2017  8:34 AM Louie BunPalacios Medina, Rosey Batheresa D wrote: CRM for notification. See Telephone encounter for: 05/31/17. Patient called and would like to know if her appt tomorrow is necessary or can she cancel that appt. Please call patient back.

## 2017-06-01 ENCOUNTER — Ambulatory Visit (INDEPENDENT_AMBULATORY_CARE_PROVIDER_SITE_OTHER): Payer: 59 | Admitting: Family Medicine

## 2017-06-01 ENCOUNTER — Encounter: Payer: Self-pay | Admitting: Family Medicine

## 2017-06-01 ENCOUNTER — Other Ambulatory Visit: Payer: Self-pay

## 2017-06-01 VITALS — BP 102/80 | HR 85 | Temp 98.6°F | Resp 18 | Ht 69.5 in | Wt 216.6 lb

## 2017-06-01 DIAGNOSIS — R7401 Elevation of levels of liver transaminase levels: Secondary | ICD-10-CM

## 2017-06-01 DIAGNOSIS — R740 Nonspecific elevation of levels of transaminase and lactic acid dehydrogenase [LDH]: Secondary | ICD-10-CM

## 2017-06-01 NOTE — Patient Instructions (Addendum)
   IF you received an x-ray today, you will receive an invoice from [facility]. Please contact [facility] at [phone] with questions or concerns regarding your invoice.   IF you received labwork today, you will receive an invoice from [facility]. Please contact [facility] at [phone] with questions or concerns regarding your invoice.   Our billing staff will not be able to assist you with questions regarding bills from these companies.  You will be contacted with the lab results as soon as they are available. The fastest way to get your results is to activate your My Chart account. Instructions are located on the last page of this paperwork. If you have not heard from us regarding the results in 2 weeks, please contact this office.     Liver Function Tests Liver function tests are blood tests to see how well your liver is working. The proteins and enzymes measured in the test can alert your health care provider to inflammation, damage, or disease in your liver. It is common to have liver function tests:  During annual physical exams.  When you are taking certain medicines.  If you have liver disease.  If you drink a lot of alcohol.  When you are not feeling well.  When you have other conditions that may affect the liver.  Substances measured may include:  Alanine transaminase (ALT). This is an enzyme in the liver.  Aspartate transaminase (AST). This is an enzyme in the liver, heart, and muscles.  Alkaline phosphatase (ALP). This is a protein in the liver, bile ducts, and bone. It is also in other body tissues.  Total bilirubin. This is a yellow pigment in bile.  Albumin. This is a protein in the liver.  Prothrombin time and international normalized ratio (PT and INR). PT measures the time that it takes for your blood to clot. INR is a calculation of blood clotting time based upon your PT result. It is also calculated based on normal ranges defined by  the laboratory that processed your lab test.  Total protein. This measures two proteins, albumin and globulin, found in the blood.  How do I prepare for this test? How you prepare will depend on which tests are being done and the reason why these tests are being done. You may need to:  Avoid eating for 4-6 hours before the test or as directed by your health care provider.  Stop taking certain medicines prior to your blood test as directed by your health care provider.  What do the results mean? It is your responsibility to obtain your test results. Ask the lab or department performing the test when and how you will get your results. Contact your health care provider to discuss any questions you have about your results. RANGE OF NORMAL VALUES Ranges for normal values may vary among different labs and hospitals. You should always check with your health care provider after having lab work or other tests done to discuss the meaning of your test results and whether your values are considered within normal limits. The following are normal ranges for substances measured in liver function tests: ALT  Infant: may be twice as high as adult values.  Child or adult: 4-36 international units/L at 37C or 4-36 units/L (SI units).  Elderly: may be slightly higher than adult values. AST  Newborn 0-5 days old: 35-140 units/L.  Child under 3 years old: 15-60 units/L.  3-6 years old: 15-50 units/L.  6-12 years old: 10-50 units/L.  12-18   years old: 10-40 units/L.  Adult: 0-35 units/L or 0-0.58 microkatal/L (SI units).  Elderly: slightly higher than adults. ALP  Child under 2 years old: 85-235 units/L.  2-8 years old: 65-210 units/L.  9-15 years old: 60-300 units/L.  16-21 years old: 30-200 units/L.  Adult: 30-120 units/L or 0.5-2.0 microkatal/L (SI units).  Elderly: slightly higher than adult. Total bilirubin  Newborn: 1.0-12.0 mg/dL or 17.1-205 micromoles/L (SI units).  Adult,  elderly, or child: 0.3-1.0 mg/dL or 5.1-17 micromoles/L. Albumin  Premature infant: 3.0-4.2 g/dL.  Newborn: 3.5-5.4 g/dL.  Infant: 4.4-5.4 g/dL.  Child: 4.0-5.9 g/dL.  Adult or elderly: 3.5-5.0 g/dL or 35-50 g/L (SI units). PT  11.0-12.5 seconds; 85%-100%. INR  0.8-1.1. Total protein  Premature infant: 4.2-7.6 g/dL.  Newborn: 4.6-7.4 g/dL.  Infant: 6.0-6.7 g/dL.  Child: 6.2-8.0 g/dL.  Adult or elderly: 6.4-8.3 g/dL or 64-83 g/L (SI units). MEANING OF RESULTS OUTSIDE NORMAL VALUE RANGES Sometimes test results can be abnormal due to other factors, such as medicines, exercise, or pregnancy. Follow up with your health care provider if you have any questions about test results outside the normal value ranges. ALT  Levels above the normal range, along with other test results, may indicate liver disease. AST  Levels above the normal range, along with other test results, may indicate liver disease. Sometimes levels also increase after burns, surgery, heart attack, muscle damage, or seizure. ALP  Levels above the normal range, along with other test results, may indicate biliary obstruction, diseases of the liver, bone disease, thyroid disease, tumors, fractures, leukemia or lymphoma, or several other conditions. People with blood type O or B may show higher levels after a fatty meal.  Levels below the normal range, along with other test results, may indicate bone and teeth conditions, malnutrition, protein deficiency, or Wilson disease. Total bilirubin  Levels above the normal range, along with other test results, may indicate problems with the liver, gallbladder, or bile ducts. Albumin  Levels above the normal range, along with other test results, may indicate dehydration. They may also be caused by a diet that is high in protein. Sometimes, the band placed around the upper arm during the process of drawing blood can cause the level of this protein in your blood to rise and  give you a result above the normal range.  Levels below the normal range, along with other tests results, may indicate kidney disease, liver disease, or malabsorption of nutrients. PT and INR  Levels above the normal range mean your blood is clotting slower than normal. This may be due to blood disorders, liver disorders, or low levels of vitamin K. Total protein  Levels above the normal range, along with other test results, may be due to infection or other diseases.  Levels below the normal range, along with other test results, may be due to an immune system disorder, bleeding, burns, kidney disorder, liver disease, trouble absorbing or getting enough nutrients, or other conditions that affect the intestines. Talk with your health care provider to discuss your results, treatment options, and if necessary, the need for more tests. Talk with your health care provider if you have any questions about your results. This information is not intended to replace advice given to you by your health care provider. Make sure you discuss any questions you have with your health care provider. Document Released: 05/06/2004 Document Revised: 12/08/2015 Document Reviewed: 08/07/2013 Elsevier Interactive Patient Education  2018 Elsevier Inc.  

## 2017-06-01 NOTE — Progress Notes (Signed)
Subjective:  By signing my name below, I, Misty Bates, attest that this documentation has been prepared under the direction and in the presence of Norberto SorensonEva Kayly Kriegel, MD Electronically Signed: Charline BillsEssence Bates, ED Scribe 06/01/2017 at 10:30 AM.   Patient ID: Misty Bates, female    DOB: 1970-08-08, 47 y.o.   MRN: [id number]  Chief Complaint  Patient presents with  . Lab Results    Pt is following up on abnormal lab results.  . Follow-up   HPI Misty Bates is a 47 y.o. female who presents to Primary Care at [facility] for f/u. Pt is fasting at this visit. She has not received a call for a pelvic US yet. She is still having pelvic pain but reports that discharged resolved after fluconazole. Reports that she has not been having daily BMs as she has not been taking Benefiber daily.  Transaminitis Pt suspects her lab work was elevated due to alcohol consumption the day prior. Reports that she has only had 1 beer which was 4 days ago.  Eye Pain Eye pain has resolved after trying eye drops. Never received a call from optho.  Past Medical History:  Diagnosis Date  . 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 Urinary incontinence    Stress incontinence   Current Outpatient Medications on File Prior to Visit  Medication Sig Dispense Refill  . acetaminophen (TYLENOL) 325 MG tablet Take 2 tablets (650 mg total) by mouth every 6 (six) hours as needed for mild pain or moderate pain. (Patient not taking: Reported on 05/18/2017)    . cycloSPORINE (RESTASIS) 0.05 % ophthalmic emulsion Place 1 drop into both eyes 2 (two) times daily. (Patient not taking: Reported on 06/01/2017) 0.4 mL 1  . Multiple Vitamins-Calcium (ONE-A-DAY WOMENS PO) Take 1 tablet by mouth daily.    Marland Kitchen. oxyCODONE (ROXICODONE) 5 MG immediate release tablet Take 1-2 tablets (5-10 mg total) by mouth every 4 (four) hours as needed for severe pain. (Patient not taking: Reported on 06/01/2017) 60 tablet 0   No current facility-administered medications on  file prior to visit.    No Known Allergies   Past Surgical History:  Procedure Laterality Date  . COLPOSCOPY    . FOOT SURGERY    . LASER ABLATION OF THE CERVIX    . OPEN REDUCTION INTERNAL FIXATION (ORIF) DISTAL RADIAL FRACTURE Right 05/18/2016   Procedure: OPEN REDUCTION INTERNAL FIXATION RIGHT FOREARM BOTH BONE FRACTURE, OPEN REDUCTION INTERNAL FIXATION RIGHT INDEX FINGER;  Surgeon: Mack Hookavid Thompson, MD;  Location: [facility] OR;  Service: Orthopedics;  Laterality: Right;  . TUBAL LIGATION     Family History  Problem Relation Age of Onset  . Diabetes Maternal Aunt   . Hypertension Maternal Aunt   . Diabetes Paternal Grandmother   . Hypertension Paternal Grandmother   . Diabetes Maternal Grandmother    Social History   Socioeconomic History  . Marital status: Single    Spouse name: None  . Number of children: None  . Years of education: None  . Highest education level: None  Social Needs  . Financial resource strain: None  . Food insecurity - worry: None  . Food insecurity - inability: None  . Transportation needs - medical: None  . Transportation needs - non-medical: None  Occupational History  . None  Tobacco Use  . Smoking status: Never Smoker  . Smokeless tobacco: Current User    Types: Snuff  . Tobacco comment: trying to quit  Substance and Sexual Activity  . Alcohol use:  Yes    Alcohol/week: 2.4 oz    Types: 4 Standard drinks or equivalent per week    Comment: occassionally  . Drug use: No  . Sexual activity: Yes    Partners: Male    Birth control/protection: Surgical    Comment: Tubal lig-1st intercourse 16 yo-5 partners  Other Topics Concern  . None  Social History Narrative  . None   Depression screen [facility] 2/9 06/01/2017 05/18/2017  Decreased Interest 0 0  Down, Depressed, Hopeless 0 0  PHQ - 2 Score 0 0     Review of Systems  Eyes: Negative for pain.  Genitourinary: Positive for pelvic pain. Negative for vaginal discharge.      Objective:   Physical  Exam  Constitutional: She is oriented to person, place, and time. She appears well-developed and well-nourished. No distress.  HENT:  Head: Normocephalic and atraumatic.  Eyes: Conjunctivae and EOM are normal.  Neck: Neck supple. No tracheal deviation present.  Cardiovascular: Normal rate.  Pulmonary/Chest: Effort normal. No respiratory distress.  Abdominal: There is tenderness (mild) in the right upper quadrant and left lower quadrant. There is negative Murphy's sign.  Musculoskeletal: Normal range of motion.  Neurological: She is alert and oriented to person, place, and time.  Skin: Skin is warm and dry.  Psychiatric: She has a normal mood and affect. Her behavior is normal.  Nursing note and vitals reviewed.  BP 102/80 (BP Location: Left Arm, Patient Position: Sitting, Cuff Size: Large)   Pulse 85   Temp 98.6 F (37 C) (Oral)   Resp 18   Ht 5' 9.5" (1.765 m)   Wt 216 lb 9.6 oz (98.2 kg)   LMP 05/19/2017   SpO2 99%   BMI 31.53 kg/m     Assessment & Plan:   1. Transaminitis   Pt binge drinks EtOH on the weekends but does not see it as a problem with negative CAGE questions and has largely stopped EtOH use since we called her about her elevated LFTs.  Not interested in quitting EtOH.  Fortunately, liver looked normal on [place] so will continue to monitor clinically for now and encourage sobriety.  Orders Placed This Encounter  Procedures  . Hepatic Function Panel  . Lipid panel    Order Specific Question:   Has the patient fasted?    Answer:   Yes  . Hepatitis B surface antibody  . Hepatitis B surface antigen  . Hepatitis A Antibody, Total   Needs Hep A vaccine - ok to do in nurse visit.  Still having pelvic pain so still needs to get pelvic/TVUS done - [facility]  I personally performed the services described in this documentation, which was scribed in my presence. The recorded information has been reviewed and considered, and addended by me as  needed.   Norberto Sorenson, M.D.  Primary Care at [facility] [address] [place], [place] [address] [phone] phone [phone] fax  06/04/17 12:23 AM

## 2017-06-02 LAB — HEPATITIS B SURFACE ANTIBODY, QUANTITATIVE: Hepatitis B Surf Ab Quant: 25.1 m[IU]/mL (ref >9.9)

## 2017-06-02 LAB — LIPID PANEL
Chol/HDL Ratio: 2.4 ratio (ref 0.0–4.4)
Cholesterol, Total: 211 mg/dL — ABNORMAL HIGH (ref 100–199)
HDL: 89 mg/dL (ref >39)
LDL CALC: 103 mg/dL — AB (ref 0–99)
Triglycerides: 97 mg/dL (ref 0–149)
VLDL Cholesterol Cal: 19 mg/dL (ref 5–40)

## 2017-06-02 LAB — HEPATIC FUNCTION PANEL
ALBUMIN: 4.6 g/dL (ref 3.5–5.5)
ALK PHOS: 134 IU/L — AB (ref 39–117)
ALT: 127 IU/L — ABNORMAL HIGH (ref 0–32)
AST: 110 IU/L — ABNORMAL HIGH (ref 0–40)
BILIRUBIN, DIRECT: 0.15 mg/dL (ref 0.00–0.40)
Bilirubin Total: 0.7 mg/dL (ref 0.0–1.2)
TOTAL PROTEIN: 7.6 g/dL (ref 6.0–8.5)

## 2017-06-02 LAB — HEPATITIS B SURFACE ANTIGEN: HEP B S AG: NEGATIVE

## 2017-06-02 LAB — HEPATITIS A ANTIBODY, TOTAL: HEP A TOTAL AB: NEGATIVE

## 2017-06-06 ENCOUNTER — Ambulatory Visit (HOSPITAL_COMMUNITY): Payer: 59 | Attending: Family Medicine

## 2017-06-25 ENCOUNTER — Encounter: Payer: Self-pay | Admitting: Gynecology

## 2017-06-25 ENCOUNTER — Ambulatory Visit (INDEPENDENT_AMBULATORY_CARE_PROVIDER_SITE_OTHER): Payer: 59 | Admitting: Gynecology

## 2017-06-25 VITALS — BP 120/76 | Ht 70.0 in | Wt 221.0 lb

## 2017-06-25 DIAGNOSIS — Z01419 Encounter for gynecological examination (general) (routine) without abnormal findings: Secondary | ICD-10-CM

## 2017-06-25 NOTE — Patient Instructions (Signed)
Follow-up in 1 year for annual exam 

## 2017-06-25 NOTE — Progress Notes (Signed)
    Minda DittoShannon Rohleder 1971/01/18 [id number]        47 y.o.  [id number] for annual gynecologic exam.  Doing well without gynecologic complaints.  Was seen earlier this year for vaginal discharge by her primary physician.  They ran STD screening to include serum GC and chlamydia.  Also did a Pap smear with HPV which was negative.  Past medical history,surgical history, problem list, medications, allergies, family history and social history were all reviewed and documented as reviewed in the EPIC chart.  ROS:  Performed with pertinent positives and negatives included in the history, assessment and plan.   Additional significant findings : None   Exam: Kennon PortelaKim Gardner assistant Vitals:   06/25/17 1500  BP: 120/76  Weight: 221 lb (100.2 kg)  Height: 5\' 10"  (1.778 m)   Body mass index is 31.71 kg/m.  General appearance:  Normal affect, orientation and appearance. Skin: Grossly normal HEENT: Without gross lesions.  No cervical or supraclavicular adenopathy. Thyroid normal.  Lungs:  Clear without wheezing, rales or rhonchi Cardiac: RR, without RMG Abdominal:  Soft, nontender, without masses, guarding, rebound, organomegaly or hernia Breasts:  Examined lying and sitting without masses, retractions, discharge or axillary adenopathy. Pelvic:  Ext, BUS, Vagina: Normal  Cervix: Normal.  High into the left of the vaginal vault.  Uterus: Anteverted, normal size, shape and contour, midline and mobile nontender   Adnexa: Without masses or tenderness    Anus and perineum: Normal   Rectovaginal: Normal sphincter tone without palpated masses or tenderness.    Assessment/Plan:  47 y.o. [id number] female for annual gynecologic exam with regular acceptable menses, tubal sterilization.   1. Pap smear/HPV 05/2017.  No Pap smear done today.  History of laser ablation of the cervix for dysplasia 2007.  Plan repeat Pap smear at 5-year interval. 2. Mammography 08/2015.  Reminded patient she is overdue and she agrees  to schedule.  Breast exam normal today. 3. Health maintenance.  No routine lab work done as patient does this elsewhere.  Follow-up in 1 year, sooner as needed.   Dara Lordsimothy P Shaquanda Graves MD, 3:23 PM 06/25/2017

## 2017-08-30 ENCOUNTER — Ambulatory Visit (INDEPENDENT_AMBULATORY_CARE_PROVIDER_SITE_OTHER): Payer: 59 | Admitting: Gynecology

## 2017-08-30 ENCOUNTER — Encounter: Payer: Self-pay | Admitting: Gynecology

## 2017-08-30 ENCOUNTER — Telehealth: Payer: Self-pay

## 2017-08-30 ENCOUNTER — Other Ambulatory Visit: Payer: Self-pay | Admitting: Gynecology

## 2017-08-30 VITALS — BP 122/82

## 2017-08-30 DIAGNOSIS — N644 Mastodynia: Secondary | ICD-10-CM | POA: Diagnosis not present

## 2017-08-30 DIAGNOSIS — Z1231 Encounter for screening mammogram for malignant neoplasm of breast: Secondary | ICD-10-CM

## 2017-08-30 NOTE — Telephone Encounter (Signed)
Patient called because she tried to schedule her screening mammo but mentioned that she is having Left breast pain and they asked her to call her doctor. I told her that with a problem it becomes a diagnostic mammogram and she needs an order for that. Her provider would like to see her and perform a breast exam prior to ordering this. Once she sees physician we will arrange this appointment for her. Appt scheduled with Dr. Velvet Bathe.

## 2017-08-30 NOTE — Patient Instructions (Signed)
Follow up for ultrasound an mammogram as arranged

## 2017-08-30 NOTE — Progress Notes (Signed)
    Misty Bates 1971-02-14 [id number]        47 y.o.  [id number] presents with left breast pain.  She has a long history of years worth of intermittent left breast pain that comes and goes.  She notices this last time before her menses her pain was more intense.  She describes this as more generalized throughout the breast.  No palpable masses.  No nipple discharge.  Right breast without significant discomfort.  Last mammogram in 2017 with follow-up ultrasound showed multiple left breast cysts.   Past medical history,surgical history, problem list, medications, allergies, family history and social history were all reviewed and documented in the EPIC chart.  Directed ROS with pertinent positives and negatives documented in the history of present illness/assessment and plan.  Exam: Kennon Portela assistant Vitals:   08/30/17 1540  BP: 122/82   General appearance:  Normal Both breasts examined lying and sitting without masses, retractions, discharge, adenopathy  Assessment/Plan:  47 y.o. [id number] with a long history of left breast pain that comes and goes consistent with fibrocystic changes.  Exam is normal today.  Recommend mammogram/diagnostic and ultrasound of the left breast.  We will go ahead and schedule this for her and she will follow-up as scheduled.  As long as mammogram/ultrasound are normal she will continue with self breast exams and follow-up if any palpable abnormalities or persistent discomfort.  We will try conservative measures to include decreasing caffeine heat to the left breast and nonsteroidal anti-inflammatories.  Possible treatment with androgens discussed such as danazol but at this point will hold on that.  Greater than 50% of our 15-minute office visit was spent in direct face to face counseling and coordination of care with the patient.      [name] MD, 3:58 PM 08/30/2017

## 2017-08-31 ENCOUNTER — Telehealth: Payer: Self-pay | Admitting: *Deleted

## 2017-08-31 DIAGNOSIS — N644 Mastodynia: Secondary | ICD-10-CM

## 2017-08-31 DIAGNOSIS — N6002 Solitary cyst of left breast: Secondary | ICD-10-CM

## 2017-08-31 NOTE — Telephone Encounter (Signed)
-----   Message from [name], MD sent at 08/30/2017  4:35 PM EDT ----- Patient needs mammogram bilateral to include diagnostic left mammogram and ultrasound reference persistent left breast pain history of left breast cysts.  Exam normal to physician

## 2017-08-31 NOTE — Telephone Encounter (Signed)
Patient scheduled on 09/04/17 @ 11:10am at breast center pt informed.

## 2017-09-04 ENCOUNTER — Ambulatory Visit
Admission: RE | Admit: 2017-09-04 | Discharge: 2017-09-04 | Disposition: A | Discharge status: Home / Self Care | Payer: 59 | Source: Ambulatory Visit | Attending: Gynecology | Admitting: Gynecology

## 2017-09-04 ENCOUNTER — Other Ambulatory Visit: Payer: 59

## 2017-09-04 ENCOUNTER — Inpatient Hospital Stay: Admission: RE | Admit: 2017-09-04 | Payer: 59 | Source: Ambulatory Visit

## 2017-09-04 ENCOUNTER — Ambulatory Visit: Payer: 59

## 2017-09-04 DIAGNOSIS — N644 Mastodynia: Secondary | ICD-10-CM

## 2017-09-04 DIAGNOSIS — N6002 Solitary cyst of left breast: Secondary | ICD-10-CM

## 2017-10-25 ENCOUNTER — Telehealth: Payer: Self-pay | Admitting: *Deleted

## 2017-10-25 NOTE — Telephone Encounter (Signed)
Patient called c/o missed cycle, states LMP:09/08/17, hx tubal ligation, states she had some PMS symptoms,also states PCP checked FSH and not menopausal. Transferred to front desk to schedule OV.

## 2017-10-26 ENCOUNTER — Encounter: Payer: Self-pay | Admitting: Gynecology

## 2017-10-26 ENCOUNTER — Ambulatory Visit (INDEPENDENT_AMBULATORY_CARE_PROVIDER_SITE_OTHER): Payer: 59 | Admitting: Gynecology

## 2017-10-26 VITALS — BP 120/76

## 2017-10-26 DIAGNOSIS — N951 Menopausal and female climacteric states: Secondary | ICD-10-CM

## 2017-10-26 DIAGNOSIS — N926 Irregular menstruation, unspecified: Secondary | ICD-10-CM

## 2017-10-26 LAB — PREGNANCY, URINE: Preg Test, Ur: NEGATIVE

## 2017-10-26 MED ORDER — MEDROXYPROGESTERONE ACETATE 10 MG PO TABS: 10.0000 mg | ORAL_TABLET | Freq: Every day | ORAL | 0 refills | Status: DC

## 2017-10-26 NOTE — Progress Notes (Signed)
    Misty DittoShannon Siracusa 11/10/1970 [id number]        47 y.o.  [id number] presents with last menstrual period 09/08/2017.  She notes over the past year her menses have started to get more irregular where she has been going longer in between her menses.  Also has developed some hot flushes and sweats.  [facility]  Having some nausea.  No constipation diarrhea.  Some suprapubic pressure symptoms but no dysuria frequency urgency.  Contraception with tubal sterilization  Past medical history,surgical history, problem list, medications, allergies, family history and social history were all reviewed and documented in the EPIC chart.  Directed ROS with pertinent positives and negatives documented in the history of present illness/assessment and plan.  Exam: Kennon PortelaKim Gardner assistant Vitals:   10/26/17 0810  BP: 120/76   General appearance:  Normal Abdomen soft nontender without masses guarding rebound Pelvic external BUS vagina normal.  Cervix normal.  Uterus grossly normal midline mobile nontender.  Adnexa without masses or tenderness.  Assessment/Plan:  47 y.o. [id number] symptoms suggesting perimenopause.  Will check baseline TSH and FSH.  Due to the suprapubic pressure will check urine analysis.  Also UPT for completeness.  Plan progesterone withdrawal with Provera 10 mg x 10 days.  Will keep symptom calendar/menstrual calendar.  If FSH elevated then will monitor.  Possible HRT options reviewed if symptoms worsen.  If FSH normal then recommended progesterone withdrawal every 8 weeks if without spontaneous menses.  She will follow-up in March when due for annual exam.  UPT returned negative.  Urine analysis showed few bacteria.  Will culture and treat if positive.  I spent a total of 15 face-to-face minutes with the patient, over 50% was spent counseling and coordination of care.     Dara Lordsimothy P Fontaine MD, 8:24 AM 10/26/2017

## 2017-10-26 NOTE — Patient Instructions (Signed)
Take the prescribed progesterone medication daily for 10 days.  This probably will bring on a period.  The office will call early next week with the blood test results as to whether you are entering menopause or not early next week.

## 2017-10-27 LAB — TSH: TSH: 1 m[IU]/L

## 2017-10-27 LAB — FOLLICLE STIMULATING HORMONE: FSH: 3.3 m[IU]/mL

## 2017-10-28 LAB — URINE CULTURE
MICRO NUMBER:: 90832852
SPECIMEN QUALITY: ADEQUATE

## 2017-10-28 LAB — URINALYSIS, COMPLETE W/RFL CULTURE
Bilirubin Urine: NEGATIVE
GLUCOSE, UA: NEGATIVE
Hyaline Cast: NONE SEEN /LPF
Ketones, ur: NEGATIVE
LEUKOCYTE ESTERASE: NEGATIVE
NITRITES URINE, INITIAL: NEGATIVE
PH: 7 (ref 5.0–8.0)
Protein, ur: NEGATIVE
Specific Gravity, Urine: 1.02 (ref 1.001–1.03)
WBC, UA: NONE SEEN /HPF (ref 0–5)

## 2017-10-28 LAB — CULTURE INDICATED

## 2017-10-29 ENCOUNTER — Other Ambulatory Visit: Payer: Self-pay | Admitting: *Deleted

## 2017-10-29 MED ORDER — SULFAMETHOXAZOLE-TRIMETHOPRIM 800-160 MG PO TABS: 1.0000 | ORAL_TABLET | Freq: Two times a day (BID) | ORAL | 0 refills | Status: DC

## 2018-01-07 IMAGING — RF DG FOREARM 2V*R*
1 series · 3 of 3 positions shown · non-contrast
Comparison: Right forearm radiographs performed earlier today at
[DATE] p.m.

CLINICAL DATA: Internal fixation of right radial and ulnar
fractures. Initial encounter.

EXAM:
RIGHT FOREARM - 2 VIEW

[Series 1: run · 3 of 3 slices shown]
[im 1/3]
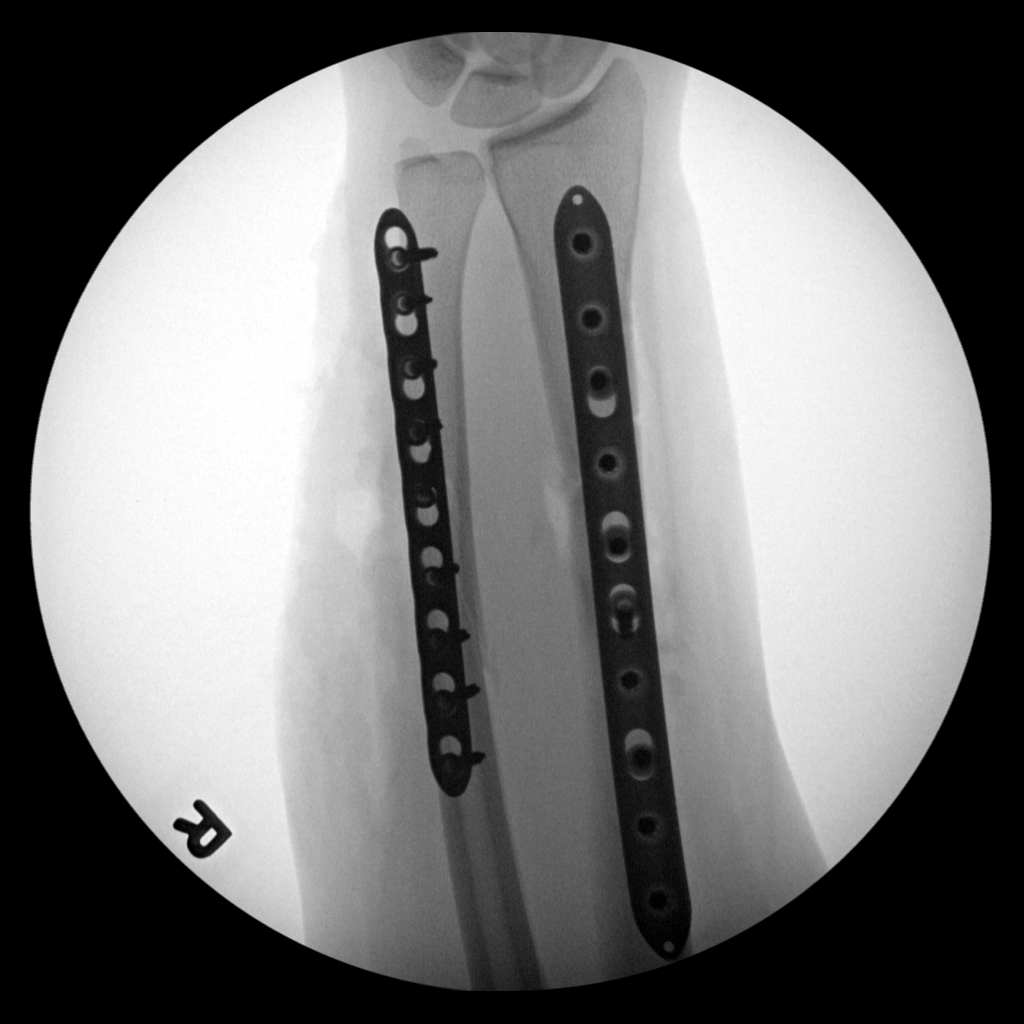
[im 2/3]
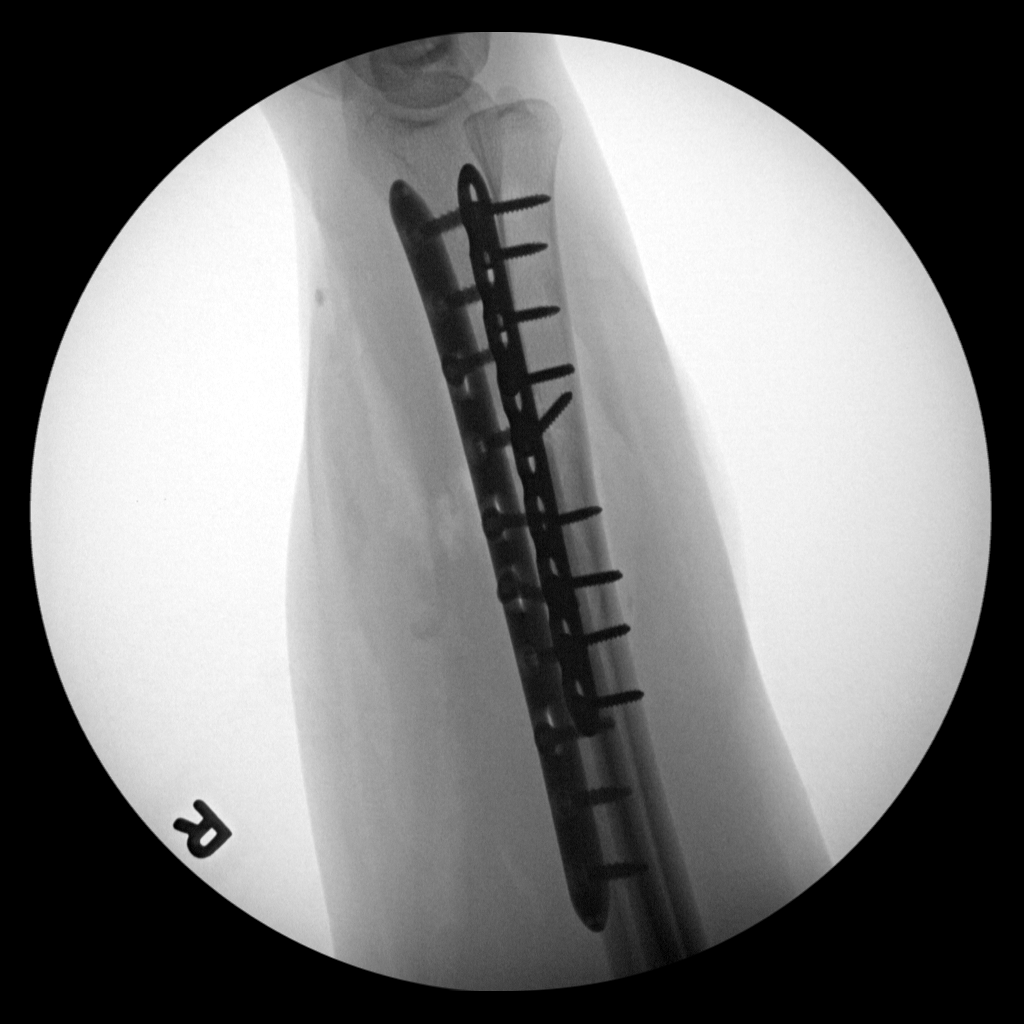
[im 3/3]
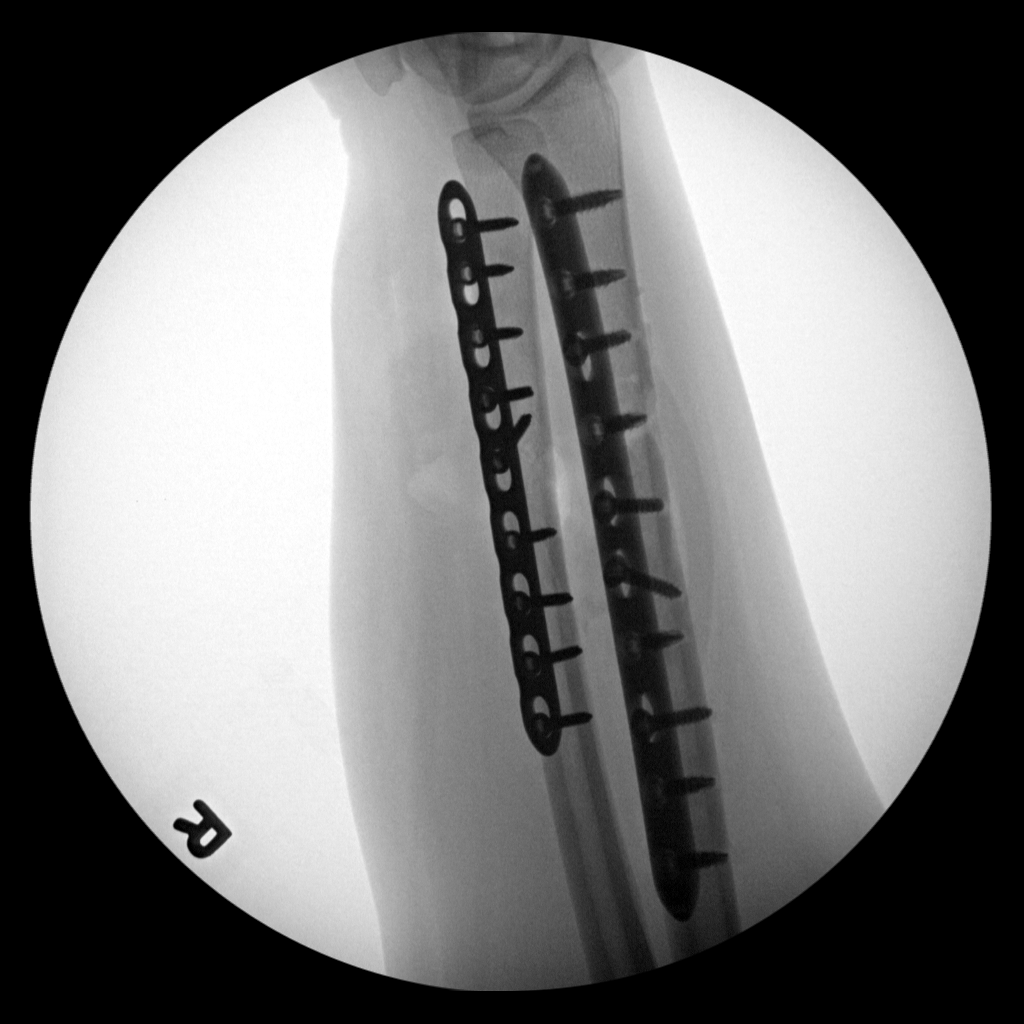

[3 of 3 positions shown; findings below may reference images not displayed]

FINDINGS: Three fluoroscopic C-arm images are provided from the OR. Plates and
screws are noted along the radius and ulna, transfixing the
patient's distal radial and ulnar fractures in grossly anatomic
alignment. No new fractures are seen.

Surrounding postoperative soft tissue air and soft tissue swelling
are noted.
IMPRESSION: Status post internal fixation of distal radial and ulnar fractures
in grossly anatomic alignment.
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
## 2018-01-14 ENCOUNTER — Ambulatory Visit (INDEPENDENT_AMBULATORY_CARE_PROVIDER_SITE_OTHER): Payer: 59 | Admitting: Gynecology

## 2018-01-14 ENCOUNTER — Encounter: Payer: Self-pay | Admitting: Gynecology

## 2018-01-14 VITALS — BP 118/78

## 2018-01-14 DIAGNOSIS — B9689 Other specified bacterial agents as the cause of diseases classified elsewhere: Secondary | ICD-10-CM | POA: Diagnosis not present

## 2018-01-14 DIAGNOSIS — N760 Acute vaginitis: Secondary | ICD-10-CM

## 2018-01-14 DIAGNOSIS — N3000 Acute cystitis without hematuria: Secondary | ICD-10-CM | POA: Diagnosis not present

## 2018-01-14 LAB — WET PREP FOR TRICH, YEAST, CLUE

## 2018-01-14 MED ORDER — SULFAMETHOXAZOLE-TRIMETHOPRIM 800-160 MG PO TABS: 1.0000 | ORAL_TABLET | Freq: Two times a day (BID) | ORAL | 0 refills | Status: DC

## 2018-01-14 MED ORDER — METRONIDAZOLE 0.75 % VA GEL: 1.0000 | Freq: Every day | VAGINAL | 0 refills | Status: DC

## 2018-01-14 NOTE — Progress Notes (Signed)
    Salima Rumer Jul 30, 1970 [id number]        47 y.o.  [id number] presents with several weeks of vaginal discharge with itching as well as some stinging with urination.  No urgency low back pain fever or chills.  Started after returning from a cruise.  Past medical history,surgical history, problem list, medications, allergies, family history and social history were all reviewed and documented in the EPIC chart.  Directed ROS with pertinent positives and negatives documented in the history of present illness/assessment and plan.  Exam: Kennon Portela assistant Vitals:   01/14/18 1603  BP: 118/78   General appearance:  Normal Spine straight without CVA tenderness Abdomen soft nontender without mass guarding rebound Pelvic external BUS vagina with slight white discharge.  Cervix normal.  Uterus grossly normal midline mobile nontender.  Adnexa without masses or tenderness.  Assessment/Plan:  47 y.o. [id number] with history as above.  Wet prep consistent with bacterial vaginosis.  Urine analysis consistent with UTI.  Will cover with Septra DS 1 p.o. twice daily x3 days and MetroGel vaginally x7 days.  Patient will follow-up if her symptoms persist, worsen or recur.    [name] MD, 4:12 PM 01/14/2018

## 2018-01-14 NOTE — Addendum Note (Signed)
Addended by: Dayna Barker on: 01/14/2018 04:38 PM   Modules accepted: Orders

## 2018-01-14 NOTE — Patient Instructions (Signed)
Take the Septra antibiotic twice daily for 3 days.  Use the MetroGel cream vaginally at bedtime for 7 days.  Follow-up if symptoms persist, worsen or recur.

## 2018-01-16 LAB — URINALYSIS, COMPLETE W/RFL CULTURE
Bilirubin Urine: NEGATIVE
Glucose, UA: NEGATIVE
Hyaline Cast: NONE SEEN /LPF
KETONES UR: NEGATIVE
NITRITES URINE, INITIAL: NEGATIVE
PROTEIN: NEGATIVE
Specific Gravity, Urine: 1.004 (ref 1.001–1.03)
pH: 6 (ref 5.0–8.0)

## 2018-01-16 LAB — URINE CULTURE
MICRO NUMBER: 91177226
SPECIMEN QUALITY:: ADEQUATE

## 2018-01-16 LAB — CULTURE INDICATED

## 2018-02-07 ENCOUNTER — Telehealth: Payer: Self-pay | Admitting: *Deleted

## 2018-02-07 NOTE — Telephone Encounter (Signed)
Patient call and left message asking for a call, I called and voicemail is not set up.

## 2018-02-12 ENCOUNTER — Ambulatory Visit (HOSPITAL_COMMUNITY)
Admission: EM | Admit: 2018-02-12 | Discharge: 2018-02-12 | Disposition: A | Discharge status: Home / Self Care | Payer: BLUE CROSS/BLUE SHIELD | Attending: Family Medicine | Admitting: Family Medicine

## 2018-02-12 ENCOUNTER — Encounter (HOSPITAL_COMMUNITY): Payer: Self-pay

## 2018-02-12 DIAGNOSIS — S90862A Insect bite (nonvenomous), left foot, initial encounter: Secondary | ICD-10-CM | POA: Diagnosis not present

## 2018-02-12 DIAGNOSIS — W57XXXA Bitten or stung by nonvenomous insect and other nonvenomous arthropods, initial encounter: Secondary | ICD-10-CM | POA: Diagnosis not present

## 2018-02-12 DIAGNOSIS — T07XXXA Unspecified multiple injuries, initial encounter: Secondary | ICD-10-CM | POA: Diagnosis not present

## 2018-02-12 MED ORDER — TRIAMCINOLONE ACETONIDE 0.1 % EX OINT: 1.0000 "application " | TOPICAL_OINTMENT | Freq: Two times a day (BID) | CUTANEOUS | 0 refills | Status: DC

## 2018-02-12 MED ORDER — HYDROXYZINE HCL 25 MG PO TABS: 25.0000 mg | ORAL_TABLET | ORAL | 0 refills | Status: DC | PRN

## 2018-02-12 NOTE — ED Provider Notes (Signed)
[facility]    CSN: [id number] Arrival date & time: 02/12/18  1649     History   Chief Complaint Chief Complaint  Patient presents with  . Insect Bite    HPI Misty Bates is a 47 y.o. female.   HPI  Patient is here to have multiple areas evaluated.  She has multiple mosquito bites in various stages of healing.  She is distressed that is that he will daily be hyperpigmented papule.  She wonders when the "dark color" will go away.  She has one on her foot that just started today.  He is actually quite red and itchy.  She went to show me the swollen night to help determine why her bites do not seem to go away.  No trouble swallowing.  No trouble breathing.  No other known allergies.  Past Medical History:  Diagnosis Date  . 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 Urinary incontinence    Stress incontinence    Patient Active Problem List   Diagnosis Date Noted  . Transaminitis 05/20/2017    Past Surgical History:  Procedure Laterality Date  . COLPOSCOPY    . FOOT SURGERY    . LASER ABLATION OF THE CERVIX    . OPEN REDUCTION INTERNAL FIXATION (ORIF) DISTAL RADIAL FRACTURE Right 05/18/2016   Procedure: OPEN REDUCTION INTERNAL FIXATION RIGHT FOREARM BOTH BONE FRACTURE, OPEN REDUCTION INTERNAL FIXATION RIGHT INDEX FINGER;  Surgeon: Mack Hook, MD;  Location: [facility] OR;  Service: Orthopedics;  Laterality: Right;  . TUBAL LIGATION      OB History    Gravida  5   Para  4   Term  4   Preterm      AB  1   Living  4     SAB  1   TAB      Ectopic      Multiple      Live Births               Home Medications    Prior to Admission medications   Medication Sig Start Date End Date Taking? Authorizing Provider  hydrOXYzine (ATARAX/VISTARIL) 25 MG tablet Take 1 tablet (25 mg total) by mouth every 4 (four) hours as needed. For itch 02/12/18   Eustace Moore, MD  Multiple Vitamin (MULTI-VITAMIN DAILY PO) Take by mouth.    [provider]    Naproxen Sodium (ALEVE PO) Take by mouth.    [provider]  Probiotic Product (PROBIOTIC PO) Take by mouth.    [provider]  triamcinolone ointment (KENALOG) 0.1 % Apply 1 application topically 2 (two) times daily. APPLY TO BITES 02/12/18   Eustace Moore, MD    Family History Family History  Problem Relation Age of Onset  . Diabetes Maternal Aunt   . Hypertension Maternal Aunt   . Diabetes Paternal Grandmother   . Hypertension Paternal Grandmother   . Diabetes Maternal Grandmother   . Lupus Paternal Aunt     Social History Social History   Tobacco Use  . Smoking status: Never Smoker  . Smokeless tobacco: Current User    Types: Snuff  . Tobacco comment: trying to quit  Substance Use Topics  . Alcohol use: Yes    Alcohol/week: 4.0 standard drinks    Types: 4 Standard drinks or equivalent per week    Comment: occassionally  . Drug use: No     Allergies   Patient has no known allergies.   Review  of Systems Review of Systems  Constitutional: Negative for chills and fever.  HENT: Negative for ear pain and sore throat.   Eyes: Negative for pain and visual disturbance.  Respiratory: Negative for cough and shortness of breath.   Cardiovascular: Negative for chest pain and palpitations.  Gastrointestinal: Negative for abdominal pain and vomiting.  Genitourinary: Negative for dysuria and hematuria.  Musculoskeletal: Negative for arthralgias and back pain.  Skin: Positive for rash. Negative for color change.  Neurological: Negative for seizures and syncope.  All other systems reviewed and are negative.    Physical Exam Triage Vital Signs ED Triage Vitals [02/12/18 1745]  Enc Vitals Group     BP (!) 160/85     Pulse Rate 87     Resp 20     Temp 97.7 F (36.5 C)     Temp Source Oral     SpO2 100 %     Weight      Height      Head Circumference      Peak Flow      Pain Score 0     Pain Loc      Pain Edu?      Excl. in GC?    No  data found.  Updated Vital Signs BP (!) 160/85 (BP Location: Left Arm)   Pulse 87   Temp 97.7 F (36.5 C) (Oral)   Resp 20   LMP  (LMP Unknown)   SpO2 100%   Visual Acuity Right Eye Distance:   Left Eye Distance:   Bilateral Distance:    Right Eye Near:   Left Eye Near:    Bilateral Near:     Physical Exam  Constitutional: She appears well-developed and well-nourished. No distress.  HENT:  Head: Normocephalic and atraumatic.  Mouth/Throat: Oropharynx is clear and moist.  Eyes: Pupils are equal, round, and reactive to light. Conjunctivae are normal.  Neck: Normal range of motion.  Cardiovascular: Normal rate.  Pulmonary/Chest: Effort normal. No respiratory distress.  Abdominal: Soft. She exhibits no distension.  Musculoskeletal: Normal range of motion. She exhibits no edema.  Neurological: She is alert.  Skin: Skin is warm and dry.  Scattered hyperpigmented papules approximately 3 to 5 mm across on both forearms and ankles.  On the left foot there is an erythematous wheal about 3 cm across from an acute bite.  She has a local allergic reaction.  No tenderness.     UC Treatments / Results  Labs (all labs ordered are listed, but only abnormal results are displayed) Labs Reviewed - No data to display  EKG None  Radiology No results found.  Procedures Procedures (including critical care time)  Medications Ordered in UC Medications - No data to display  Initial Impression / Assessment and Plan / UC Course  I have reviewed the triage vital signs and the nursing notes.  Pertinent labs & imaging results that were available during my care of the patient were reviewed by me and considered in my medical decision making (see chart for details).     Discussed that with an acute but she should take a Benadryl, put ice on it, put some cortisone cream on it, right away.  Discussed the importance of not scratching the bites. Final Clinical Impressions(s) / UC Diagnoses    Final diagnoses:  Insect bite, unspecified site, initial encounter     Discharge Instructions     May use benadryl or zyrtec for the itching as OTC medicines I am prescribing  hydroxyzine for itching.  It can cause drowsiness. Use the ointment on old and new bites to reduce inflammation Return as needed    ED Prescriptions    Medication Sig Dispense Auth. Provider   hydrOXYzine (ATARAX/VISTARIL) 25 MG tablet Take 1 tablet (25 mg total) by mouth every 4 (four) hours as needed. For itch 20 tablet Eustace Moore, MD   triamcinolone ointment (KENALOG) 0.1 % Apply 1 application topically 2 (two) times daily. APPLY TO BITES 30 g Eustace Moore, MD     Controlled Substance Prescriptions [place] Controlled Substance Registry consulted? Not Applicable   Eustace Moore, MD 02/12/18 1815

## 2018-02-12 NOTE — Discharge Instructions (Signed)
May use benadryl or zyrtec for the itching as OTC medicines I am prescribing hydroxyzine for itching.  It can cause drowsiness. Use the ointment on old and new bites to reduce inflammation Return as needed

## 2018-02-12 NOTE — ED Triage Notes (Signed)
Pt presents with old insect bite areas that she would like to have checked.

## 2018-06-16 ENCOUNTER — Ambulatory Visit (HOSPITAL_COMMUNITY)
Admission: EM | Admit: 2018-06-16 | Discharge: 2018-06-16 | Disposition: A | Discharge status: Home / Self Care | Payer: BLUE CROSS/BLUE SHIELD | Attending: Emergency Medicine | Admitting: Emergency Medicine

## 2018-06-16 ENCOUNTER — Ambulatory Visit (INDEPENDENT_AMBULATORY_CARE_PROVIDER_SITE_OTHER): Payer: BLUE CROSS/BLUE SHIELD

## 2018-06-16 ENCOUNTER — Encounter (HOSPITAL_COMMUNITY): Payer: Self-pay | Admitting: Emergency Medicine

## 2018-06-16 DIAGNOSIS — R0989 Other specified symptoms and signs involving the circulatory and respiratory systems: Secondary | ICD-10-CM | POA: Diagnosis not present

## 2018-06-16 DIAGNOSIS — J0110 Acute frontal sinusitis, unspecified: Secondary | ICD-10-CM

## 2018-06-16 DIAGNOSIS — R0789 Other chest pain: Secondary | ICD-10-CM | POA: Diagnosis not present

## 2018-06-16 MED ORDER — AMOXICILLIN-POT CLAVULANATE 875-125 MG PO TABS: 1.0000 | ORAL_TABLET | Freq: Two times a day (BID) | ORAL | 0 refills | Status: DC

## 2018-06-16 NOTE — ED Provider Notes (Signed)
[facility]    CSN: [id number] Arrival date & time: 06/16/18  1402     History   Chief Complaint Chief Complaint  Patient presents with  . Flu Like Symptoms    HPI Misty Bates is a 48 y.o. female.   The history is provided by the patient.  Cough  Cough characteristics:  Productive Sputum characteristics:  Nondescript Severity:  Moderate Onset quality:  Gradual Duration:  4 weeks Timing:  Constant Progression:  Waxing and waning Chronicity:  New Context: upper respiratory infection   Context: not occupational exposure, not sick contacts and not weather changes   Relieved by: alka-seltzer, but then it returns. Worsened by:  Nothing Ineffective treatments:  Decongestant Associated symptoms: chest pain, headaches, rhinorrhea, sinus congestion and sore throat   Associated symptoms: no chills, no ear pain, no fever, no rash and no shortness of breath   Associated symptoms comment:  Upper back pain worsened by cough   Past Medical History:  Diagnosis Date  . 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 Urinary incontinence    Stress incontinence    Patient Active Problem List   Diagnosis Date Noted  . Transaminitis 05/20/2017    Past Surgical History:  Procedure Laterality Date  . COLPOSCOPY    . FOOT SURGERY    . LASER ABLATION OF THE CERVIX    . OPEN REDUCTION INTERNAL FIXATION (ORIF) DISTAL RADIAL FRACTURE Right 05/18/2016   Procedure: OPEN REDUCTION INTERNAL FIXATION RIGHT FOREARM BOTH BONE FRACTURE, OPEN REDUCTION INTERNAL FIXATION RIGHT INDEX FINGER;  Surgeon: Mack Hookavid Thompson, MD;  Location: [facility];  Service: Orthopedics;  Laterality: Right;  . TUBAL LIGATION      OB History    Gravida  5   Para  4   Term  4   Preterm      AB  1   Living  4     SAB  1   TAB      Ectopic      Multiple      Live Births               Home Medications    Prior to Admission medications   Medication Sig Start Date End Date Taking? Authorizing  Provider  hydrOXYzine (ATARAX/VISTARIL) 25 MG tablet Take 1 tablet (25 mg total) by mouth every 4 (four) hours as needed. For itch Patient not taking: Reported on 06/16/2018 02/12/18   Eustace MooreNelson, Yvonne Sue, MD  Multiple Vitamin (MULTI-VITAMIN DAILY PO) Take by mouth.    [provider]  Naproxen Sodium (ALEVE PO) Take by mouth.    [provider]  Probiotic Product (PROBIOTIC PO) Take by mouth.    [provider]  triamcinolone ointment (KENALOG) 0.1 % Apply 1 application topically 2 (two) times daily. APPLY TO BITES Patient not taking: Reported on 06/16/2018 02/12/18   Eustace MooreNelson, Yvonne Sue, MD    Family History Family History  Problem Relation Age of Onset  . Diabetes Maternal Aunt   . Hypertension Maternal Aunt   . Diabetes Paternal Grandmother   . Hypertension Paternal Grandmother   . Diabetes Maternal Grandmother   . Lupus Paternal Aunt     Social History Social History   Tobacco Use  . Smoking status: Never Smoker  . Smokeless tobacco: Current User    Types: Snuff  . Tobacco comment: trying to quit  Substance Use Topics  . Alcohol use: Yes    Alcohol/week: 4.0 standard drinks    Types: 4  Standard drinks or equivalent per week    Comment: occassionally  . Drug use: No     Allergies   Patient has no known allergies.   Review of Systems Review of Systems  Constitutional: Negative for chills, fever and unexpected weight change.       Sweating at night  HENT: Positive for congestion, postnasal drip, rhinorrhea, sinus pressure and sore throat. Negative for ear pain.   Eyes: Negative for pain and visual disturbance.  Respiratory: Positive for cough. Negative for shortness of breath.   Cardiovascular: Positive for chest pain. Negative for palpitations.  Gastrointestinal: Negative for abdominal pain and vomiting.  Genitourinary: Negative for dysuria and hematuria.  Musculoskeletal: Positive for back pain. Negative for arthralgias.  Skin: Negative  for color change and rash.  Neurological: Positive for headaches. Negative for seizures and syncope.  All other systems reviewed and are negative.    Physical Exam Triage Vital Signs ED Triage Vitals  Enc Vitals Group     BP 06/16/18 1441 138/88     Pulse Rate 06/16/18 1441 93     Resp 06/16/18 1441 16     Temp 06/16/18 1441 97.6 F (36.4 C)     Temp Source 06/16/18 1441 Oral     SpO2 06/16/18 1441 98 %     Weight --      Height --      Head Circumference --      Peak Flow --      Pain Score 06/16/18 1442 2     Pain Loc --      Pain Edu? --      Excl. in GC? --    No data found.  Updated Vital Signs BP 138/88 (BP Location: Left Arm)   Pulse 93   Temp 97.6 F (36.4 C) (Oral)   Resp 16   LMP 06/16/2018   SpO2 98%   Visual Acuity Right Eye Distance:   Left Eye Distance:   Bilateral Distance:    Right Eye Near:   Left Eye Near:    Bilateral Near:     Physical Exam Vitals signs and nursing note reviewed.  Constitutional:      General: She is not in acute distress.    Appearance: She is well-developed.  HENT:     Head: Normocephalic and atraumatic.     Nose: Congestion present.     Comments: Swollen nasal turbinates Eyes:     Conjunctiva/sclera: Conjunctivae normal.  Neck:     Musculoskeletal: Neck supple.  Cardiovascular:     Rate and Rhythm: Normal rate and regular rhythm.     Heart sounds: No murmur.  Pulmonary:     Effort: Pulmonary effort is normal. No respiratory distress.     Breath sounds: Normal breath sounds.  Abdominal:     Palpations: Abdomen is soft.     Tenderness: There is no abdominal tenderness.  Skin:    General: Skin is warm and dry.  Neurological:     Mental Status: She is alert.      UC Treatments / Results  Labs (all labs ordered are listed, but only abnormal results are displayed) Labs Reviewed - No data to display  EKG None  Radiology Dg Chest 2 View  Result Date: 06/16/2018 CLINICAL DATA:  Congestion for several  weeks EXAM: CHEST - 2 VIEW COMPARISON:  05/18/2016 FINDINGS: The heart size and mediastinal contours are within normal limits. Both lungs are clear. The visualized skeletal structures are unremarkable. IMPRESSION: No active  cardiopulmonary disease. Electronically Signed   By: Alcide Clever M.D.   On: 06/16/2018 15:18    Procedures Procedures (including critical care time)  Medications Ordered in UC Medications - No data to display  Initial Impression / Assessment and Plan / UC Course  I have reviewed the triage vital signs and the nursing notes.  Pertinent labs & imaging results that were available during my care of the patient were reviewed by me and considered in my medical decision making (see chart for details).    Patient is well-appearing with normal vital signs.  Her symptoms have been ongoing for 1 month, and I would treat her due to duration of symptoms.  She has also begun to develop some back pain which I think might be from coughing.  I did counsel her that if her symptoms persist she will need more extensive work-up.  We considered atypical infections, fungal infections, malignancy, and other conditions.  She was aware of return precautions and symptomatic treatment. Final Clinical Impressions(s) / UC Diagnoses   Final diagnoses:  Acute non-recurrent frontal sinusitis   Discharge Instructions   None    ED Prescriptions    Medication Sig Dispense Auth. Provider   amoxicillin-clavulanate (AUGMENTIN) 875-125 MG tablet Take 1 tablet by mouth every 12 (twelve) hours. 14 tablet Marshell Levan, MD     Controlled Substance Prescriptions [place] Controlled Substance Registry consulted? No   Marshell Levan, MD 06/16/18 [phone]

## 2018-06-16 NOTE — ED Triage Notes (Signed)
Pt c/o flu like symptoms x4 weeks.  body aches, cough, congestion, sore throat.

## 2018-06-27 ENCOUNTER — Encounter: Payer: Self-pay | Admitting: Gynecology

## 2018-06-27 ENCOUNTER — Ambulatory Visit (INDEPENDENT_AMBULATORY_CARE_PROVIDER_SITE_OTHER): Payer: BLUE CROSS/BLUE SHIELD | Admitting: Gynecology

## 2018-06-27 ENCOUNTER — Other Ambulatory Visit: Payer: Self-pay

## 2018-06-27 VITALS — BP 118/74 | Ht 70.0 in | Wt 227.0 lb

## 2018-06-27 DIAGNOSIS — Z01419 Encounter for gynecological examination (general) (routine) without abnormal findings: Secondary | ICD-10-CM | POA: Diagnosis not present

## 2018-06-27 NOTE — Progress Notes (Signed)
    Samanatha Kernaghan [birth year] [id number]        48 y.o.  [id number] for annual gynecologic exam.  Without gynecologic complaints  Past medical history,surgical history, problem list, medications, allergies, family history and social history were all reviewed and documented as reviewed in the EPIC chart.  ROS:  Performed with pertinent positives and negatives included in the history, assessment and plan.   Additional significant findings : None   Exam: Kennon Portela assistant Vitals:   06/27/18 1451  BP: 118/74  Weight: 227 lb (103 kg)  Height: 5\' 10"  (1.778 m)   Body mass index is 32.57 kg/m.  General appearance:  Normal affect, orientation and appearance. Skin: Grossly normal HEENT: Without gross lesions.  No cervical or supraclavicular adenopathy. Thyroid normal.  Lungs:  Clear without wheezing, rales or rhonchi Cardiac: RR, without RMG Abdominal:  Soft, nontender, without masses, guarding, rebound, organomegaly or hernia Breasts:  Examined lying and sitting without masses, retractions, discharge or axillary adenopathy. Pelvic:  Ext, BUS, Vagina: Normal  Cervix: Normal  Uterus: Anteverted, normal size, shape and contour, midline and mobile nontender   Adnexa: Without masses or tenderness    Anus and perineum: Normal   Rectovaginal: Normal sphincter tone without palpated masses or tenderness.    Assessment/Plan:  48 y.o. [id number] female for annual gynecologic exam.  With regular menses, tubal sterilization  1. Pap smear/HPV 05/2017.  No Pap smear done today.  History of laser ablation of the cervix for dysplasia 2007.  Normal Pap smears since.  Plan repeat Pap smear/HPV at 5-year interval per current screening guidelines. 2. Mammography 08/2017.  Reminded patient she is coming due.  Breast exam normal today. 3. Low back pain.  Patient having some mild low back pain.  Exam is normal.  No urinary symptoms such as dysuria urgency frequency.  Will check urine analysis for  completeness.  Will follow-up with her primary provider if her low back pain continues. 4. Health maintenance.  No routine lab work done as patient does this elsewhere.  Follow-up 1 year, sooner as needed.   [name] MD, 3:35 PM 06/27/2018

## 2018-06-27 NOTE — Patient Instructions (Signed)
Follow-up in 1 year for annual exam, sooner if any issues. 

## 2018-06-28 LAB — URINALYSIS, COMPLETE W/RFL CULTURE
BILIRUBIN URINE: NEGATIVE
Bacteria, UA: NONE SEEN /HPF
GLUCOSE, UA: NEGATIVE
Hgb urine dipstick: NEGATIVE
Hyaline Cast: NONE SEEN /LPF
KETONES UR: NEGATIVE
LEUKOCYTE ESTERASE: NEGATIVE
NITRITES URINE, INITIAL: NEGATIVE
PROTEIN: NEGATIVE
RBC / HPF: NONE SEEN /HPF (ref 0–2)
Specific Gravity, Urine: 1.021 (ref 1.001–1.03)
WBC, UA: NONE SEEN /HPF (ref 0–5)
pH: 6 (ref 5.0–8.0)

## 2018-06-28 LAB — NO CULTURE INDICATED

## 2018-07-05 ENCOUNTER — Other Ambulatory Visit: Payer: Self-pay

## 2018-07-05 ENCOUNTER — Emergency Department (HOSPITAL_COMMUNITY)
Admission: EM | Admit: 2018-07-05 | Discharge: 2018-07-06 | Disposition: A | Discharge status: Home / Self Care | Payer: BLUE CROSS/BLUE SHIELD | Attending: Emergency Medicine | Admitting: Emergency Medicine

## 2018-07-05 ENCOUNTER — Encounter (HOSPITAL_COMMUNITY): Payer: Self-pay | Admitting: Oncology

## 2018-07-05 DIAGNOSIS — R109 Unspecified abdominal pain: Secondary | ICD-10-CM | POA: Diagnosis not present

## 2018-07-05 DIAGNOSIS — Z79899 Other long term (current) drug therapy: Secondary | ICD-10-CM | POA: Diagnosis not present

## 2018-07-05 DIAGNOSIS — N946 Dysmenorrhea, unspecified: Secondary | ICD-10-CM | POA: Insufficient documentation

## 2018-07-05 NOTE — ED Notes (Signed)
Pt saturated chux w/ blood.  Trail of vaginal blood from room to bathroom.

## 2018-07-05 NOTE — ED Triage Notes (Signed)
Pt presents w/ one day of abdominal pain.  States she started her menstrual cycle yesterday and cramping is so severe it feels like contractions. Pt also c/o right lower back pain.  Pt began vomiting in the lobby stated it was d/t the pain.

## 2018-07-05 NOTE — ED Provider Notes (Signed)
[facility] EMERGENCY DEPARTMENT Provider Note   CSN: [id number] Arrival date & time: 07/05/18  2138    History   Chief Complaint Chief Complaint  Patient presents with  . Abdominal Pain    HPI Misty Bates is a 48 y.o. female.     The history is provided by the patient and medical records.  Abdominal Pain     48 year old female presenting to the ED with lower abdominal cramping.  She reports she started her menstrual cycle yesterday, this was normal time for her.  States usually her first 1 to 2 days of the worst, cycles usually lasting 7 days total.  Today she had severe lower abdominal cramping which she states felt like "contractions".  States after she arrived here she had "a big gush of bleeding" which was a sigh of relief.  She did vomit once in the waiting room but thinks it was due to pain.  Currently, she has no pain and no nausea.  She does report some issues with pain at end of urinary stream on and off for a few months now.  She saw her OBGYN, Dr. Audie Box, last week on 06/27/18 with normal exam and UA at that time.  PAP was not done as she has had normal PAPs for several years now.  No unusual discharge, fever, chills, back pain, or flank pain.  Past Medical History:  Diagnosis Date  . 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 Urinary incontinence    Stress incontinence    Patient Active Problem List   Diagnosis Date Noted  . Transaminitis 05/20/2017    Past Surgical History:  Procedure Laterality Date  . COLPOSCOPY    . FOOT SURGERY    . LASER ABLATION OF THE CERVIX    . OPEN REDUCTION INTERNAL FIXATION (ORIF) DISTAL RADIAL FRACTURE Right 05/18/2016   Procedure: OPEN REDUCTION INTERNAL FIXATION RIGHT FOREARM BOTH BONE FRACTURE, OPEN REDUCTION INTERNAL FIXATION RIGHT INDEX FINGER;  Surgeon: Mack Hook, MD;  Location: [facility] OR;  Service: Orthopedics;  Laterality: Right;  . TUBAL LIGATION       OB History    Gravida  5   Para  4   Term  4    Preterm      AB  1   Living  4     SAB  1   TAB      Ectopic      Multiple      Live Births               Home Medications    Prior to Admission medications   Medication Sig Start Date End Date Taking? Authorizing Provider  Multiple Vitamin (MULTI-VITAMIN DAILY PO) Take by mouth.    [provider]  Probiotic Product (PROBIOTIC PO) Take by mouth.    [provider]    Family History Family History  Problem Relation Age of Onset  . Diabetes Maternal Aunt   . Hypertension Maternal Aunt   . Diabetes Paternal Grandmother   . Hypertension Paternal Grandmother   . Diabetes Maternal Grandmother   . Lupus Paternal Aunt     Social History Social History   Tobacco Use  . Smoking status: Never Smoker  . Smokeless tobacco: Current User    Types: Snuff  . Tobacco comment: trying to quit  Substance Use Topics  . Alcohol use: Yes    Alcohol/week: 4.0 standard drinks    Types: 4 Standard drinks or equivalent per week  Comment: occassionally  . Drug use: No     Allergies   Patient has no known allergies.   Review of Systems Review of Systems  Gastrointestinal: Positive for abdominal pain.  All other systems reviewed and are negative.    Physical Exam Updated Vital Signs BP 112/75   Pulse 78   Temp 98.3 F (36.8 C) (Oral)   Resp 20   Ht 5\' 10"  (1.778 m)   Wt 102 kg   LMP 07/04/2018   SpO2 96%   BMI 32.27 kg/m   Physical Exam Vitals signs and nursing note reviewed.  Constitutional:      Appearance: She is well-developed.  HENT:     Head: Normocephalic and atraumatic.  Eyes:     Conjunctiva/sclera: Conjunctivae normal.     Pupils: Pupils are equal, round, and reactive to light.  Neck:     Musculoskeletal: Normal range of motion.  Cardiovascular:     Rate and Rhythm: Normal rate and regular rhythm.     Heart sounds: Normal heart sounds.  Pulmonary:     Effort: Pulmonary effort is normal.     Breath sounds: Normal  breath sounds.  Abdominal:     General: Bowel sounds are normal.     Palpations: Abdomen is soft.     Tenderness: There is no abdominal tenderness. There is no guarding or rebound.     Comments: Soft, non-tender, normal bowel sounds  Musculoskeletal: Normal range of motion.  Skin:    General: Skin is warm and dry.  Neurological:     Mental Status: She is alert and oriented to person, place, and time.      ED Treatments / Results  Labs (all labs ordered are listed, but only abnormal results are displayed) Labs Reviewed  URINALYSIS, ROUTINE W REFLEX MICROSCOPIC - Abnormal; Notable for the following components:      Result Value   APPearance HAZY (*)    Hgb urine dipstick LARGE (*)    Protein, ur 30 (*)    RBC / HPF >50 (*)    All other components within normal limits    EKG None  Radiology No results found.  Procedures Procedures (including critical care time)  Medications Ordered in ED Medications - No data to display   Initial Impression / Assessment and Plan / ED Course  I have reviewed the triage vital signs and the nursing notes.  Pertinent labs & imaging results that were available during my care of the patient were reviewed by me and considered in my medical decision making (see chart for details).  48 year old female here with lower abdominal cramping.  Started menstrual cycle yesterday.  Apparently had large passage of blood on arrival here and vomited x1, now is currently pain-free.  She denies any further nausea.  She saw her OB/GYN for routine visit last week and tests were normal.  She did not have a Pap smear done.  Her abdomen is soft and benign.  She is afebrile and nontoxic.  Vitals are stable.  She does report some intermittent dysuria at the end of urinary stream over the past few months.  UA from last week on chart review was negative.  UA rechecked today and remains without any signs of infection.  There is blood noted, however likely menstrual.   Patient is continued feeling well here without any recurrence of pain.  I have low suspicion for acute GYN related issues such as ovarian torsion.  Feel she is stable for discharge home  with symptomatic care.  I recommended that she follow-up closely with her GYN for any ongoing issues.  She can return here for any new or acute changes.  Final Clinical Impressions(s) / ED Diagnoses   Final diagnoses:  Menstrual cramps    ED Discharge Orders         Ordered    ibuprofen (ADVIL,MOTRIN) 800 MG tablet  3 times daily     07/06/18 0041    ondansetron (ZOFRAN ODT) 4 MG disintegrating tablet  Every 8 hours PRN     07/06/18 0041           Garlon Hatchet, PA-C 07/06/18 0230    Margarita Grizzle, MD 07/06/18 1407

## 2018-07-06 LAB — URINALYSIS, ROUTINE W REFLEX MICROSCOPIC
Bacteria, UA: NONE SEEN
Bilirubin Urine: NEGATIVE
Glucose, UA: NEGATIVE mg/dL
Ketones, ur: NEGATIVE mg/dL
Leukocytes,Ua: NEGATIVE
Nitrite: NEGATIVE
Protein, ur: 30 mg/dL — AB
RBC / HPF: >50 RBC/hpf — ABNORMAL HIGH (ref 0–5)
Specific Gravity, Urine: 1.015 (ref 1.005–1.030)
pH: 7 (ref 5.0–8.0)

## 2018-07-06 MED ORDER — IBUPROFEN 800 MG PO TABS: 800.0000 mg | ORAL_TABLET | Freq: Three times a day (TID) | ORAL | 0 refills | Status: DC

## 2018-07-06 MED ORDER — ONDANSETRON 4 MG PO TBDP: 4.0000 mg | ORAL_TABLET | Freq: Three times a day (TID) | ORAL | 0 refills | Status: DC | PRN

## 2018-07-06 NOTE — Discharge Instructions (Signed)
Take the prescribed medication as directed to help with cramping and/or nausea. Follow-up with your OB-GYN if any ongoing issues. Return to the ED for new or worsening symptoms.

## 2018-11-22 ENCOUNTER — Other Ambulatory Visit: Payer: Self-pay

## 2018-11-22 ENCOUNTER — Telehealth: Payer: Self-pay

## 2018-11-22 ENCOUNTER — Other Ambulatory Visit: Payer: BC Managed Care – PPO

## 2018-11-22 ENCOUNTER — Other Ambulatory Visit: Payer: Self-pay | Admitting: Gynecology

## 2018-11-22 DIAGNOSIS — G4709 Other insomnia: Secondary | ICD-10-CM

## 2018-11-22 DIAGNOSIS — R61 Generalized hyperhidrosis: Secondary | ICD-10-CM

## 2018-11-22 NOTE — Telephone Encounter (Signed)
Left message for patient to call me

## 2018-11-22 NOTE — Telephone Encounter (Signed)
Patient said she is having night sweats and cannot sleep. She asked what she could do.  Last [facility] check was 10/26/17 and was normal at 3.3.

## 2018-11-22 NOTE — Telephone Encounter (Signed)
Patient advised. She wants to come today and have these labs drawn. Order placed and lab appointment made.

## 2018-11-22 NOTE — Telephone Encounter (Signed)
Recommend checking TSH and FSH then office visit following to review results and discuss options

## 2018-11-23 LAB — FOLLICLE STIMULATING HORMONE: FSH: 59.7 m[IU]/mL

## 2018-11-23 LAB — TSH: TSH: 1 mIU/L

## 2018-11-28 ENCOUNTER — Other Ambulatory Visit: Payer: Self-pay

## 2018-11-29 ENCOUNTER — Encounter: Payer: Self-pay | Admitting: Gynecology

## 2018-11-29 ENCOUNTER — Ambulatory Visit (INDEPENDENT_AMBULATORY_CARE_PROVIDER_SITE_OTHER): Payer: BC Managed Care – PPO | Admitting: Gynecology

## 2018-11-29 VITALS — BP 118/74

## 2018-11-29 DIAGNOSIS — N951 Menopausal and female climacteric states: Secondary | ICD-10-CM

## 2018-11-29 MED ORDER — ESTRADIOL 1 MG PO TABS: 1.0000 mg | ORAL_TABLET | Freq: Every day | ORAL | 6 refills | Status: DC

## 2018-11-29 MED ORDER — PROGESTERONE MICRONIZED 100 MG PO CAPS: 100.0000 mg | ORAL_CAPSULE | Freq: Every day | ORAL | 6 refills | Status: DC

## 2018-11-29 NOTE — Patient Instructions (Signed)
Start on the hormone replacement as we discussed.  Call if you have any issues.

## 2018-11-29 NOTE — Progress Notes (Signed)
    Misty Bates [birth year] [id number]        48 y.o.  [id number] patient had called with hot flashes, sweats, sleep disturbances.  Last menstrual period 3 to 4 months ago with no bleeding since.  She came in for lab work which showed a normal TSH and an [place] of 59.7.  Past medical history,surgical history, problem list, medications, allergies, family history and social history were all reviewed and documented in the EPIC chart.  Directed ROS with pertinent positives and negatives documented in the history of present illness/assessment and plan.  Exam: Vitals:   11/29/18 1130  BP: 118/74   General appearance:  Normal   Assessment/Plan:  48 y.o. [id number] with menopausal symptoms, 3 to 4 months of amenorrhea and elevated FSH.  We discussed the perimenopause and what to expect.  We discussed treatment options to include HRT, pharmacologic nonhormonal such as Effexor and OTC products such as Estroven.  I reviewed HRT with her to include the risks versus benefits.  Symptom relief, cardiovascular and bone health when started early versus thrombosis such as stroke heart attack DVT in the breast cancer issue.  Various routes of administration also reviewed.  At this point the patient would like to start HRT.  We will start with estradiol 1 mg and Prometrium 100 mg at bedtime.  #30 with 6 refills provided.  She will call me if she does not have adequate symptom relief.  She will call me if she does any bleeding.  Otherwise assuming she does well then she will follow-up at her next annual exam.    Anastasio Auerbach MD, 12:01 PM 11/29/2018

## 2019-01-08 ENCOUNTER — Encounter: Payer: Self-pay | Admitting: Gynecology

## 2019-04-03 ENCOUNTER — Ambulatory Visit: Payer: Self-pay

## 2019-04-03 ENCOUNTER — Other Ambulatory Visit: Payer: Self-pay

## 2019-04-03 ENCOUNTER — Encounter: Payer: Self-pay | Admitting: Orthopaedic Surgery

## 2019-04-03 ENCOUNTER — Ambulatory Visit (INDEPENDENT_AMBULATORY_CARE_PROVIDER_SITE_OTHER): Payer: BC Managed Care – PPO | Admitting: Orthopaedic Surgery

## 2019-04-03 DIAGNOSIS — G8929 Other chronic pain: Secondary | ICD-10-CM | POA: Diagnosis not present

## 2019-04-03 DIAGNOSIS — M25562 Pain in left knee: Secondary | ICD-10-CM | POA: Diagnosis not present

## 2019-04-03 MED ORDER — BUPIVACAINE HCL 0.25 % IJ SOLN
2.0000 mL | INTRAMUSCULAR | Status: AC | PRN
  Administered 2019-04-03: 09:00:00 2 mL via INTRA_ARTICULAR

## 2019-04-03 MED ORDER — LIDOCAINE HCL 1 % IJ SOLN
2.0000 mL | INTRAMUSCULAR | Status: AC | PRN
  Administered 2019-04-03: 09:00:00 2 mL

## 2019-04-03 MED ORDER — METHYLPREDNISOLONE ACETATE 40 MG/ML IJ SUSP
40.0000 mg | INTRAMUSCULAR | Status: AC | PRN
  Administered 2019-04-03: 09:00:00 40 mg via INTRA_ARTICULAR

## 2019-04-03 NOTE — Progress Notes (Signed)
Office Visit Note   Patient: Misty Bates           Date of Birth: 01-16-1971           MRN: [id number] Visit Date: 04/03/2019              Requested by: Shawnee Knapp, MD [place],  [place] [id number] PCP: Shawnee Knapp, MD   Assessment & Plan: Visit Diagnoses:  1. Chronic pain of left knee     Plan: Impression is left knee degenerative lateral meniscus tear.  We will inject the left knee with cortisone.  She is still exhibiting pain in the next 3 or 4 weeks she will let us know we will obtain an MRI of the left knee to assess her meniscus.  Call with concerns or questions in the meantime.  Follow-Up Instructions: Return if symptoms worsen or fail to improve.   Orders:  Orders Placed This Encounter  Procedures  . Large Joint Inj  . XR KNEE 3 VIEW LEFT   No orders of the defined types were placed in this encounter.     Procedures: Large Joint Inj: L knee on 04/03/2019 9:15 AM Indications: pain Details: 22 G needle, anterolateral approach Medications: 2 mL lidocaine 1 %; 2 mL bupivacaine 0.25 %; 40 mg methylPREDNISolone acetate 40 MG/ML      Clinical Data: No additional findings.   Subjective: Chief Complaint  Patient presents with  . Left Knee - Pain    HPI patient is a pleasant 48 year old female who presents to our clinic today with left knee pain.  This began approximately 2 weeks ago when she went to sit on the edge of her bed.  She felt a pain and pop to the lateral knee.  Since then she notes a weird sensation to the lateral aspect.  Pain is worse going from a seated to standing position.  She has not taken any over-the-counter pain medication for this.  She denies any numbness, tingling or burning.  No previous left knee pain, cortisone injection or surgical intervention.  Review of Systems as detailed in HPI.  All others reviewed and are negative.   Objective: Vital Signs: There were no vitals taken for this visit.  Physical Exam  well-developed well-nourished female no acute distress.  Alert and oriented x3.  Ortho Exam left knee exam shows no effusion.  Moderate tenderness along the posterior horn lateral meniscus.  Negative lateral [place].  Range of motion 0 to 115 degrees.  Stable to valgus varus stress.  She is neurovascular intact distally.  Specialty Comments:  No specialty comments available.  Imaging: XR KNEE 3 VIEW LEFT  Result Date: 04/03/2019 Moderate joint space narrowing all 3 compartments    PMFS History: Patient Active Problem List   Diagnosis Date Noted  . Transaminitis 05/20/2017   Past Medical History:  Diagnosis Date  . 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 Urinary incontinence    Stress incontinence    Family History  Problem Relation Age of Onset  . Diabetes Maternal Aunt   . Hypertension Maternal Aunt   . Diabetes Paternal Grandmother   . Hypertension Paternal Grandmother   . Diabetes Maternal Grandmother   . Lupus Paternal Aunt     Past Surgical History:  Procedure Laterality Date  . COLPOSCOPY    . FOOT SURGERY    . LASER ABLATION OF THE CERVIX    . OPEN REDUCTION INTERNAL FIXATION (ORIF) DISTAL RADIAL FRACTURE Right  05/18/2016   Procedure: OPEN REDUCTION INTERNAL FIXATION RIGHT FOREARM BOTH BONE FRACTURE, OPEN REDUCTION INTERNAL FIXATION RIGHT INDEX FINGER;  Surgeon: Mack Hook, MD;  Location: [facility] OR;  Service: Orthopedics;  Laterality: Right;  . TUBAL LIGATION     Social History   Occupational History  . Not on file  Tobacco Use  . Smoking status: Never Smoker  . Smokeless tobacco: Current User    Types: Snuff  . Tobacco comment: trying to quit  Substance and Sexual Activity  . Alcohol use: Yes    Alcohol/week: 4.0 standard drinks    Types: 4 Standard drinks or equivalent per week    Comment: occassionally  . Drug use: No  . Sexual activity: Yes    Partners: Male    Birth control/protection: Surgical    Comment: Tubal lig-1st intercourse 16 yo-5 partners

## 2019-06-30 ENCOUNTER — Encounter: Payer: BC Managed Care – PPO | Admitting: Obstetrics and Gynecology

## 2019-07-11 ENCOUNTER — Other Ambulatory Visit: Payer: Self-pay

## 2019-07-14 ENCOUNTER — Other Ambulatory Visit: Payer: Self-pay

## 2019-07-14 ENCOUNTER — Encounter: Payer: Self-pay | Admitting: Obstetrics and Gynecology

## 2019-07-14 ENCOUNTER — Ambulatory Visit (INDEPENDENT_AMBULATORY_CARE_PROVIDER_SITE_OTHER): Payer: BC Managed Care – PPO | Admitting: Obstetrics and Gynecology

## 2019-07-14 VITALS — BP 124/80 | Ht 70.0 in | Wt 223.0 lb

## 2019-07-14 DIAGNOSIS — N939 Abnormal uterine and vaginal bleeding, unspecified: Secondary | ICD-10-CM | POA: Diagnosis not present

## 2019-07-14 DIAGNOSIS — Z01419 Encounter for gynecological examination (general) (routine) without abnormal findings: Secondary | ICD-10-CM | POA: Diagnosis not present

## 2019-07-14 DIAGNOSIS — Z13220 Encounter for screening for lipoid disorders: Secondary | ICD-10-CM | POA: Diagnosis not present

## 2019-07-14 DIAGNOSIS — Z79890 Hormone replacement therapy (postmenopausal): Secondary | ICD-10-CM | POA: Diagnosis not present

## 2019-07-14 DIAGNOSIS — N951 Menopausal and female climacteric states: Secondary | ICD-10-CM | POA: Diagnosis not present

## 2019-07-14 NOTE — Addendum Note (Signed)
Addended by: Dayna Barker on: 07/14/2019 03:41 PM   Modules accepted: Orders

## 2019-07-14 NOTE — Progress Notes (Signed)
Misty Bates [birth year] [id number]  SUBJECTIVE:  49 y.o. [id number] female for annual routine gynecologic exam and Pap smear. She has been having hot flashes and night sweats and sleep disturbances.  She was started on estrogen and progesterone in August 2020 at which time she had been amenorrheic for about 3 to 4 months, FSH was in the menopausal range at that time. Since starting the estrogen, she has had worsening of her typically heavy periods.  She is having a monthly period, but says she has been having heavy bleeding and increased pelvic pain during her period.  She historically has had heavy periods but they are worse with taking the hormones.   Current Outpatient Medications  Medication Sig Dispense Refill  . Calcium-Magnesium-Vitamin D (CALCIUM 1200+D3 PO) Take by mouth.    . Flaxseed, Linseed, (FLAX SEED OIL PO) Take by mouth.    Marland Kitchen ibuprofen (ADVIL,MOTRIN) 800 MG tablet Take 1 tablet (800 mg total) by mouth 3 (three) times daily. 21 tablet 0  . Multiple Vitamin (MULTIVITAMIN WITH MINERALS) TABS tablet Take 1 tablet by mouth daily.    . ondansetron (ZOFRAN ODT) 4 MG disintegrating tablet Take 1 tablet (4 mg total) by mouth every 8 (eight) hours as needed for nausea. 10 tablet 0  . Probiotic Product (PROBIOTIC PO) Take 1 tablet by mouth daily.      No current facility-administered medications for this visit.   Allergies: Patient has no known allergies.  Patient's last menstrual period was 06/29/2019.  Past medical history,surgical history, problem list, medications, allergies, family history and social history were all reviewed and documented as reviewed in the EPIC chart.  ROS:  Feeling well. No dyspnea or chest pain on exertion.  No abdominal pain, change in bowel habits, black or bloody stools.  No urinary tract symptoms. GYN ROS: normal menses, + abnormal bleeding, no pelvic pain or discharge, no breast pain or new or enlarging lumps on self exam. No neurological complaints.    OBJECTIVE:  BP 124/80   Ht 5\' 10"  (1.778 m)   Wt 223 lb (101.2 kg)   LMP 06/29/2019   BMI 32.00 kg/m  The patient appears well, alert, oriented x 3, in no distress. ENT normal.  Neck supple. No cervical or supraclavicular adenopathy or thyromegaly.  Lungs are clear, good air entry, no wheezes, rhonchi or rales. S1 and S2 normal, no murmurs, regular rate and rhythm.  Abdomen soft without tenderness, guarding, mass or organomegaly.  Neurological is normal, no focal findings.  BREAST EXAM: breasts appear normal, no suspicious masses, no skin or nipple changes or axillary nodes  PELVIC EXAM: VULVA: normal appearing vulva with no masses, tenderness or lesions, VAGINA: normal appearing vagina with normal color and discharge, no lesions, CERVIX: normal appearing cervix without discharge or lesions, UTERUS: uterus is normal size, shape, consistency and nontender, ADNEXA: normal adnexa in size, nontender and no masses, PAP: Pap smear done today, thin-prep method  Chaperone: 07/01/2019 present during the examination  ASSESSMENT:  49 y.o. 43 here for annual gynecologic exam  PLAN:   1.  Perimenopausal.  She is continuing to have monthly menses, but heavy periods now worsened being on HRT.  I recommended that she wean off of HRT, go to 0.5 mg estradiol daily for the next month and then quit altogether.  When she is done taking the estrogen she can stop the Prometrium.  She will plan to do this.  I also discussed that other options for control of vasomotor symptoms include  Effexor or other SSRIs and we discussed some potential side effects.  She would like to just try being off medication for right now.  She does have some Ambien that would help with her sleeping patterns.  I also suggested trying black cohosh, soy isoflavones.  She can also use melatonin as needed for sleep. 2.  Abnormal uterine bleeding.  She historically has had heavy periods.  Feels they have been worse since being on the  HRT.  I recommended getting a sonohysterogram/ultrasound for the AUB and the pelvic pain she has had.  She will plan to schedule this at checkout today. 3. Pap smear 2019.  History of abnormal Pap smears had a laser ablation for dysplasia in 2007.  Given this history and the abnormal uterine bleeding as of late, a Pap smear was collected today. 4. Mammogram 08/2017.  Normal breast exam today.  Annual mammogram is overdue and I recommended that she get this scheduled as soon as possible. 5. Colonoscopy.  Recommended that she call to get this scheduled as it is recommended to start for routine colon cancer screening. 6. Health maintenance.  She will proceed to lab today for routine screening blood work (lipids, CBC, CMP).  Return annually or sooner, prn.  Joseph Pierini MD, FACOG  07/14/19

## 2019-07-14 NOTE — Patient Instructions (Addendum)
Let's have you schedule a sonohystogram/pelvic ultrasound to evaluate the heavy menstrual bleeding and pelvic pain You can stop the estrogen, I would recommend cutting back to 0.5 mg per day the next month and then stopping after that.  Once you stop the estrogen, you can stop the progesterone (prometrium) Please schedule an annual mammogram with [facility] or [facility] Please schedule a colonoscopy for colon cancer screening with one of the GI doctor groups in town

## 2019-07-16 LAB — PAP IG W/ RFLX HPV ASCU

## 2019-07-23 ENCOUNTER — Other Ambulatory Visit: Payer: Self-pay

## 2019-07-24 ENCOUNTER — Encounter: Payer: Self-pay | Admitting: Obstetrics and Gynecology

## 2019-07-24 ENCOUNTER — Ambulatory Visit (INDEPENDENT_AMBULATORY_CARE_PROVIDER_SITE_OTHER): Payer: BC Managed Care – PPO

## 2019-07-24 ENCOUNTER — Other Ambulatory Visit: Payer: Self-pay | Admitting: Obstetrics and Gynecology

## 2019-07-24 ENCOUNTER — Ambulatory Visit (INDEPENDENT_AMBULATORY_CARE_PROVIDER_SITE_OTHER): Payer: BC Managed Care – PPO | Admitting: Obstetrics and Gynecology

## 2019-07-24 VITALS — BP 118/76

## 2019-07-24 DIAGNOSIS — N939 Abnormal uterine and vaginal bleeding, unspecified: Secondary | ICD-10-CM

## 2019-07-24 DIAGNOSIS — R9389 Abnormal findings on diagnostic imaging of other specified body structures: Secondary | ICD-10-CM | POA: Diagnosis not present

## 2019-07-24 DIAGNOSIS — N840 Polyp of corpus uteri: Secondary | ICD-10-CM | POA: Diagnosis not present

## 2019-07-24 DIAGNOSIS — N898 Other specified noninflammatory disorders of vagina: Secondary | ICD-10-CM

## 2019-07-24 DIAGNOSIS — N83202 Unspecified ovarian cyst, left side: Secondary | ICD-10-CM

## 2019-07-24 LAB — WET PREP FOR TRICH, YEAST, CLUE

## 2019-07-24 NOTE — Progress Notes (Signed)
Misty Bates  [birth year] [id number]  HPI The patient is a 49 y.o. [id number] who presents today for a pelvic ultrasound to evaluate her heavy menstrual periods.  She has stopped taking HRT as this was making her bleeding worse.  She is taking and nonhormonal perimenopausal [place] does help her menopausal symptoms.  Previous tubal ligation.  Past medical history,surgical history, problem list, medications, allergies, family history and social history were all reviewed and documented as reviewed in the EPIC chart.   Physical Exam  BP 118/76   LMP 06/29/2019   General: Pleasant female, no acute distress, alert and oriented PELVIC EXAM: VULVA: normal appearing vulva with no masses, tenderness or lesions, VAGINA: normal appearing vagina with normal color and thick yellow-white discharge, no odor, no lesions, CERVIX:  Deeply embedded and retroverted in the vagina, difficult to view with vaginal sidewall prolapse, multiple attempts with different speculums are required to view the cervix, finally achieving success with the extra-large Graves speculum, normal atrophic appearing cervix somewhat flush with the vagina, without discharge or lesions  Vaginal wet mount negative for hyphae, clue cells, trichomonads, no WBC, few bacteria, 7-12 epithelial cells/hpf  Procedure note Endometrial biopsy As noted above, multiple different attempts and size of speculum were utilized to tend to visualize the cervix, which was finally successful with use of the extra large Graves speculum.  The cervix was cleansed with a Betadine swab.  The stenotic external cervical os was dilated with a Cytobrush followed by the cervical os finder.  The endometrial Pipelle sampler was then easily introduced into the endometrial cavity and sound length measurement was taken of 7 cm.  The plunger was withdrawn causing suction in the Pipelle and the device was moved about the cavity for about 10 sec.  The Pipelle was removed then the  sample was emptied in a specimen cup, maintaining sterility.  The Pipelle was reintroduced into the endometrial cavity to collect any remaining sample.  The specimen contents were collected and sent to pathology for analysis.  The patient tolerated the procedure well without any complication.  Pelvic ultrasound Anteverted enlarged uterus Several small intramural fibroids noted, the largest measuring 2.7 cm in maximum dimension Endometrial thickness 9.9 mm A symmetrical endometrium with 2 endometrial polyps, 9 x 5 mm and 6 x 4 mm Bilateral ovaries are normal with sparse follicles 2.8 x 2.1 cm simple follicle on left ovary. No adnexal masses.  No free fluid.   Assessment 49 yo G5P4 with abnormal uterine bleeding/menorrhagia with multiple small uterine fibroids and endometrial polyps, small left ovarian cyst  Plan Results are reviewed with the patient from the ultrasound examination today which indicate multiple small uterine fibroids which appear to be in an intramural location, therefore not contributing to her AUB.  She does have what appear to be 2 endometrial polyps.  I offered her hysteroscopy D&C as a more complete and thorough evaluation, but at the very least given her level of bleeding, I recommend endometrial sampling, which we did in the office today.  I did caution her that an office endometrial biopsy sometimes does not sample endometrial polyps sufficiently, but should at least give [place] a good global view on whether or not any hyperplasia or abnormal tissue is present within the uterus.  She will continue to hold off on estrogen HRT at this time.  We will follow up on the results of the biopsy and let her know. Discussed the finding of a small ovarian cyst 2.8 cm on the left ovary.  I do recommend repeating an ultrasound in 6 months to document resolution.  This is ordered. Vaginal wet mount was negative today.  I recommended conservative measures to avoid irritation of the vaginal area  and let us know if the symptoms do not improve.   Theresia Majors MD, FACOG 07/24/19

## 2019-07-28 LAB — PATHOLOGY REPORT

## 2019-07-28 LAB — TISSUE SPECIMEN

## 2019-09-03 ENCOUNTER — Other Ambulatory Visit: Payer: Self-pay

## 2019-09-03 ENCOUNTER — Encounter (HOSPITAL_COMMUNITY): Payer: Self-pay

## 2019-09-03 ENCOUNTER — Ambulatory Visit (HOSPITAL_COMMUNITY)
Admission: EM | Admit: 2019-09-03 | Discharge: 2019-09-03 | Disposition: A | Discharge status: Home / Self Care | Payer: BC Managed Care – PPO | Attending: Emergency Medicine | Admitting: Emergency Medicine

## 2019-09-03 DIAGNOSIS — Z20822 Contact with and (suspected) exposure to covid-19: Secondary | ICD-10-CM | POA: Insufficient documentation

## 2019-09-03 NOTE — Discharge Instructions (Addendum)
Dr. Clelia Croft has a private practice on [employer].  If you are unable to find her, below is a list of primary care practices who are taking new patients for you to follow-up with.  [facility] internal medicine clinic Ground Floor - [facility], [address] [place], [place], [place] [address] [phone]  [facility] [address] [place], [place] [address] [phone]  [facility] [address] [place], [place] [address] [phone]  [facility] [phone] [address] [place] [address]  [facility]: [address] [place] [address]  [phone]  [facility]: [address] [place] [address]   [phone]  [facility]: [address] [place] [address]  [phone]  [facility] : [address] [place] [address] [phone]  [facility]: [address] [place] [place] [address] [phone]  [facility]: [address] [place] [address] [phone]  Dr. Oneal Grout [address] [facility] [place] [address]  [phone]  Dr. Jackie Plum, [facility]. [address] [place], [place] [address]  [phone]  Go to www.goodrx.com to look up your medications. This will give you a list of where you can find your prescriptions at the most affordable prices. Or ask the pharmacist what the cash price is, or if they have any other discount programs available to help make your medication more affordable. This can be less expensive than what you would pay with insurance.

## 2019-09-03 NOTE — ED Triage Notes (Signed)
Pt presents to UC for COVID text after exposure. Pt reports her daughter tested positive for COVID yesterday. Pt denies any symptoms. Pt had the 2nd Pfizer  COVID vaccine 1 month ago.

## 2019-09-03 NOTE — ED Provider Notes (Signed)
HPI  SUBJECTIVE:  Misty Bates is a 49 y.o. female who presents for Covid testing.  Her daughter who lives with her tested positive for Covid 2 days ago.  Patient has gotten 2 doses of the Pfizer vaccine.  Last dose was over a month ago.  Patient denies symptoms-no body aches, headaches, fevers, nasal congestion, sore throat, loss of sense of taste or smell, cough, shortness of breath, nausea, vomiting, diarrhea, abdominal pain.  Past medical history negative for diabetes, hypertension, coronary disease, chronic kidney disease, immunocompromise, cancer, HIV.  PMD: None.    Past Medical History:  Diagnosis Date  . 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 Urinary incontinence    Stress incontinence    Past Surgical History:  Procedure Laterality Date  . COLPOSCOPY    . FOOT SURGERY    . LASER ABLATION OF THE CERVIX    . OPEN REDUCTION INTERNAL FIXATION (ORIF) DISTAL RADIAL FRACTURE Right 05/18/2016   Procedure: OPEN REDUCTION INTERNAL FIXATION RIGHT FOREARM BOTH BONE FRACTURE, OPEN REDUCTION INTERNAL FIXATION RIGHT INDEX FINGER;  Surgeon: Mack Hook, MD;  Location: [facility] OR;  Service: Orthopedics;  Laterality: Right;  . TUBAL LIGATION      Family History  Problem Relation Age of Onset  . Diabetes Maternal Aunt   . Hypertension Maternal Aunt   . Diabetes Paternal Grandmother   . Hypertension Paternal Grandmother   . Diabetes Maternal Grandmother   . Lupus Paternal Aunt     Social History   Tobacco Use  . Smoking status: Never Smoker  . Smokeless tobacco: Current User    Types: Snuff  . Tobacco comment: trying to quit  Substance Use Topics  . Alcohol use: Yes    Alcohol/week: 4.0 standard drinks    Types: 4 Standard drinks or equivalent per week    Comment: occassionally  . Drug use: No    No current facility-administered medications for this encounter.  Current Outpatient Medications:  .  Calcium-Magnesium-Vitamin D (CALCIUM 1200+D3 PO), Take by mouth., Disp: , Rfl:  .   Flaxseed, Linseed, (FLAX SEED OIL PO), Take by mouth., Disp: , Rfl:  .  ibuprofen (ADVIL,MOTRIN) 800 MG tablet, Take 1 tablet (800 mg total) by mouth 3 (three) times daily., Disp: 21 tablet, Rfl: 0 .  Multiple Vitamin (MULTIVITAMIN WITH MINERALS) TABS tablet, Take 1 tablet by mouth daily., Disp: , Rfl:  .  Probiotic Product (PROBIOTIC PO), Take 1 tablet by mouth daily. , Disp: , Rfl:   No Known Allergies   ROS  As noted in HPI.   Physical Exam  BP 118/83 (BP Location: Right Arm)   Pulse 84   Temp 98.1 F (36.7 C) (Oral)   Resp 18   SpO2 100%   Constitutional: Well developed, well nourished, no acute distress Eyes:  EOMI, conjunctiva normal bilaterally HENT: Normocephalic, atraumatic,mucus membranes moist Respiratory: Normal inspiratory effort Cardiovascular: Normal rate GI: nondistended skin: No rash, skin intact Musculoskeletal: no deformities Neurologic: Alert & oriented x 3, no focal neuro deficits Psychiatric: Speech and behavior appropriate   ED Course   Medications - No data to display  Orders Placed This Encounter  Procedures  . SARS CORONAVIRUS 2 (TAT 6-24 HRS) Nasopharyngeal Nasopharyngeal Swab    Standing Status:   Standing    Number of Occurrences:   1    Order Specific Question:   Is this test for diagnosis or screening    Answer:   Screening    Order Specific Question:   Symptomatic for  COVID-19 as defined by CDC    Answer:   No    Order Specific Question:   Hospitalized for COVID-19    Answer:   No    Order Specific Question:   Admitted to ICU for COVID-19    Answer:   No    Order Specific Question:   Previously tested for COVID-19    Answer:   No    Order Specific Question:   Resident in a congregate (group) care setting    Answer:   No    Order Specific Question:   Employed in healthcare setting    Answer:   No    Order Specific Question:   Pregnant    Answer:   No    Order Specific Question:   Has patient completed COVID vaccination(s) (2  doses of Pfizer/Moderna 1 dose of [employer])    Answer:   Yes    No results found for this or any previous visit (from the past 24 hour(s)). No results found.  ED Clinical Impression  1. Encounter for laboratory testing for COVID-19 virus   2. Close exposure to COVID-19 virus      ED Assessment/Plan  Covid PCR sent.  Advised patient to have everyone wear mask when they are around her daughter.  Will provide primary care list for ongoing care.  No orders of the defined types were placed in this encounter.   *This clinic note was created using Dragon dictation software. Therefore, there may be occasional mistakes despite careful proofreading.   ?    Melynda Ripple, MD 09/03/19 1334

## 2019-09-04 LAB — SARS CORONAVIRUS 2 (TAT 6-24 HRS): SARS Coronavirus 2: NEGATIVE

## 2020-02-10 DIAGNOSIS — M79601 Pain in right arm: Secondary | ICD-10-CM | POA: Diagnosis not present

## 2020-04-19 ENCOUNTER — Encounter (HOSPITAL_COMMUNITY): Payer: Self-pay

## 2020-04-19 ENCOUNTER — Ambulatory Visit (HOSPITAL_COMMUNITY)
Admission: EM | Admit: 2020-04-19 | Discharge: 2020-04-19 | Disposition: A | Discharge status: Home / Self Care | Payer: BC Managed Care – PPO

## 2020-04-19 ENCOUNTER — Other Ambulatory Visit: Payer: Self-pay

## 2020-04-19 NOTE — ED Triage Notes (Addendum)
Pt in with c/o ST and chills that started yesterday. Also c/o ear pain  States she took medicine for a head cold and it helped

## 2020-04-19 NOTE — ED Triage Notes (Signed)
Pt called from front lobby and Pt called on mobil with no answer

## 2020-04-20 ENCOUNTER — Ambulatory Visit (HOSPITAL_COMMUNITY)
Admission: EM | Admit: 2020-04-20 | Discharge: 2020-04-20 | Disposition: A | Discharge status: Home / Self Care | Payer: BC Managed Care – PPO | Attending: Family Medicine | Admitting: Family Medicine

## 2020-04-20 ENCOUNTER — Encounter (HOSPITAL_COMMUNITY): Payer: Self-pay | Admitting: Emergency Medicine

## 2020-04-20 DIAGNOSIS — Z20822 Contact with and (suspected) exposure to covid-19: Secondary | ICD-10-CM | POA: Insufficient documentation

## 2020-04-20 DIAGNOSIS — J069 Acute upper respiratory infection, unspecified: Secondary | ICD-10-CM | POA: Diagnosis not present

## 2020-04-20 LAB — SARS CORONAVIRUS 2 (TAT 6-24 HRS): SARS Coronavirus 2: NEGATIVE

## 2020-04-20 NOTE — Discharge Instructions (Signed)
Go home to rest Drink plenty of fluids Take Tylenol for pain or fever You may take over-the-counter cough and cold medicines as needed You must quarantine at home until your test result is available You can check for your test result in MyChart  

## 2020-04-20 NOTE — ED Triage Notes (Signed)
Pt presents with cough, headache, sore throat and congestion after COVID exposure xs 5 days ago.

## 2020-04-22 NOTE — ED Provider Notes (Signed)
[facility]    CSN: [id number] Arrival date & time: 04/20/20  1110      History   Chief Complaint Chief Complaint  Patient presents with  . Covid Exposure  . Cough  . Headache  . Nasal Congestion    HPI Misty Bates is a 50 y.o. female.   HPI   Patient is here for respiratory illness in light of COVID exposure. She is not vaccinated.  Sick for 2 d after exposure 5 d ago Has cough, sore throat and congestion with headache No fever or chills or body aches   Past Medical History:  Diagnosis Date  . 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 Urinary incontinence    Stress incontinence    Patient Active Problem List   Diagnosis Date Noted  . Transaminitis 05/20/2017    Past Surgical History:  Procedure Laterality Date  . COLPOSCOPY    . FOOT SURGERY    . LASER ABLATION OF THE CERVIX    . OPEN REDUCTION INTERNAL FIXATION (ORIF) DISTAL RADIAL FRACTURE Right 05/18/2016   Procedure: OPEN REDUCTION INTERNAL FIXATION RIGHT FOREARM BOTH BONE FRACTURE, OPEN REDUCTION INTERNAL FIXATION RIGHT INDEX FINGER;  Surgeon: Mack Hook, MD;  Location: [facility] OR;  Service: Orthopedics;  Laterality: Right;  . TUBAL LIGATION      OB History    Gravida  5   Para  4   Term  4   Preterm      AB  1   Living  4     SAB  1   IAB      Ectopic      Multiple      Live Births               Home Medications    Prior to Admission medications   Medication Sig Start Date End Date Taking? Authorizing Provider  Calcium-Magnesium-Vitamin D (CALCIUM 1200+D3 PO) Take by mouth.    [provider]  Flaxseed, Linseed, (FLAX SEED OIL PO) Take by mouth.    [provider]  ibuprofen (ADVIL,MOTRIN) 800 MG tablet Take 1 tablet (800 mg total) by mouth 3 (three) times daily. 07/06/18   Garlon Hatchet, PA-C  Multiple Vitamin (MULTIVITAMIN WITH MINERALS) TABS tablet Take 1 tablet by mouth daily.    [provider]  Probiotic Product (PROBIOTIC PO) Take  1 tablet by mouth daily.     [provider]    Family History Family History  Problem Relation Age of Onset  . Diabetes Maternal Aunt   . Hypertension Maternal Aunt   . Diabetes Paternal Grandmother   . Hypertension Paternal Grandmother   . Diabetes Maternal Grandmother   . Lupus Paternal Aunt     Social History Social History   Tobacco Use  . Smoking status: Never Smoker  . Smokeless tobacco: Current User    Types: Snuff  . Tobacco comment: trying to quit  Vaping Use  . Vaping Use: Never used  Substance Use Topics  . Alcohol use: Yes    Alcohol/week: 4.0 standard drinks    Types: 4 Standard drinks or equivalent per week    Comment: occassionally  . Drug use: No     Allergies   Patient has no known allergies.   Review of Systems Review of Systems See HPI  Physical Exam Triage Vital Signs ED Triage Vitals  Enc Vitals Group     BP 04/20/20 1247 127/83     Pulse Rate 04/20/20 1247  99     Resp 04/20/20 1247 17     Temp 04/20/20 1247 98.1 F (36.7 C)     Temp Source 04/20/20 1247 Oral     SpO2 04/20/20 1247 97 %     Weight --      Height --      Head Circumference --      Peak Flow --      Pain Score 04/20/20 1246 0     Pain Loc --      Pain Edu? --      Excl. in GC? --    No data found.  Updated Vital Signs BP 127/83 (BP Location: Left Arm)   Pulse 99   Temp 98.1 F (36.7 C) (Oral)   Resp 17   LMP 03/22/2020 (Approximate)   SpO2 97%       Physical Exam Constitutional:      General: She is not in acute distress.    Appearance: She is well-developed and well-nourished.     Comments: Appears tired  HENT:     Head: Normocephalic and atraumatic.     Mouth/Throat:     Mouth: Oropharynx is clear and moist.  Eyes:     Conjunctiva/sclera: Conjunctivae normal.     Pupils: Pupils are equal, round, and reactive to light.  Cardiovascular:     Rate and Rhythm: Normal rate.  Pulmonary:     Effort: Pulmonary effort is normal. No  respiratory distress.     Comments: lungs are clear Abdominal:     General: There is no distension.     Palpations: Abdomen is soft.  Musculoskeletal:        General: No edema. Normal range of motion.     Cervical back: Normal range of motion.  Skin:    General: Skin is warm and dry.  Neurological:     General: No focal deficit present.     Mental Status: She is alert.  Psychiatric:        Behavior: Behavior normal.      UC Treatments / Results  Labs (all labs ordered are listed, but only abnormal results are displayed) Labs Reviewed  SARS CORONAVIRUS 2 (TAT 6-24 HRS)    EKG   Radiology No results found.  Procedures Procedures (including critical care time)  Medications Ordered in UC Medications - No data to display  Initial Impression / Assessment and Plan / UC Course  I have reviewed the triage vital signs and the nursing notes.  Pertinent labs & imaging results that were available during my care of the patient were reviewed by me and considered in my medical decision making (see chart for details).    Final Clinical Impressions(s) / UC Diagnoses   Final diagnoses:  Viral URI with cough  Close exposure to COVID-19 virus     Discharge Instructions     Go home to rest Drink plenty of fluids Take Tylenol for pain or fever You may take over-the-counter cough and cold medicines as needed You must quarantine at home until your test result is available You can check for your test result in MyChart    ED Prescriptions    None     PDMP not reviewed this encounter.   Eustace Moore, MD 04/22/20 [phone]

## 2020-04-27 DIAGNOSIS — U071 COVID-19: Secondary | ICD-10-CM | POA: Diagnosis not present

## 2020-05-14 DIAGNOSIS — Z01419 Encounter for gynecological examination (general) (routine) without abnormal findings: Secondary | ICD-10-CM | POA: Diagnosis not present

## 2020-05-14 DIAGNOSIS — Z130 Encounter for screening for diseases of the blood and blood-forming organs and certain disorders involving the immune mechanism: Secondary | ICD-10-CM | POA: Diagnosis not present

## 2020-05-14 DIAGNOSIS — E669 Obesity, unspecified: Secondary | ICD-10-CM | POA: Diagnosis not present

## 2020-05-14 DIAGNOSIS — Z1389 Encounter for screening for other disorder: Secondary | ICD-10-CM | POA: Diagnosis not present

## 2020-05-14 DIAGNOSIS — Z113 Encounter for screening for infections with a predominantly sexual mode of transmission: Secondary | ICD-10-CM | POA: Diagnosis not present

## 2020-06-14 DIAGNOSIS — N858 Other specified noninflammatory disorders of uterus: Secondary | ICD-10-CM | POA: Diagnosis not present

## 2020-06-14 DIAGNOSIS — Z8742 Personal history of other diseases of the female genital tract: Secondary | ICD-10-CM | POA: Diagnosis not present

## 2020-06-14 DIAGNOSIS — Z1231 Encounter for screening mammogram for malignant neoplasm of breast: Secondary | ICD-10-CM | POA: Diagnosis not present

## 2020-06-17 DIAGNOSIS — M19041 Primary osteoarthritis, right hand: Secondary | ICD-10-CM | POA: Diagnosis not present

## 2020-06-17 DIAGNOSIS — Z8616 Personal history of COVID-19: Secondary | ICD-10-CM | POA: Diagnosis not present

## 2020-07-14 ENCOUNTER — Encounter: Payer: BC Managed Care – PPO | Admitting: Obstetrics and Gynecology

## 2020-08-18 DIAGNOSIS — E669 Obesity, unspecified: Secondary | ICD-10-CM | POA: Diagnosis not present

## 2020-08-18 DIAGNOSIS — Z0000 Encounter for general adult medical examination without abnormal findings: Secondary | ICD-10-CM | POA: Diagnosis not present

## 2020-08-18 DIAGNOSIS — R030 Elevated blood-pressure reading, without diagnosis of hypertension: Secondary | ICD-10-CM | POA: Diagnosis not present

## 2020-08-18 DIAGNOSIS — E785 Hyperlipidemia, unspecified: Secondary | ICD-10-CM | POA: Diagnosis not present

## 2020-09-08 ENCOUNTER — Ambulatory Visit (INDEPENDENT_AMBULATORY_CARE_PROVIDER_SITE_OTHER): Payer: BC Managed Care – PPO

## 2020-09-08 ENCOUNTER — Ambulatory Visit (INDEPENDENT_AMBULATORY_CARE_PROVIDER_SITE_OTHER): Payer: BC Managed Care – PPO | Admitting: Podiatry

## 2020-09-08 ENCOUNTER — Other Ambulatory Visit: Payer: Self-pay

## 2020-09-08 DIAGNOSIS — M2011 Hallux valgus (acquired), right foot: Secondary | ICD-10-CM | POA: Diagnosis not present

## 2020-09-08 DIAGNOSIS — B351 Tinea unguium: Secondary | ICD-10-CM

## 2020-09-08 DIAGNOSIS — M2041 Other hammer toe(s) (acquired), right foot: Secondary | ICD-10-CM

## 2020-09-08 MED ORDER — TERBINAFINE HCL 250 MG PO TABS: 250.0000 mg | ORAL_TABLET | Freq: Every day | ORAL | 0 refills | Status: AC

## 2020-09-08 NOTE — Progress Notes (Signed)
   Subjective: 50 y.o. female presenting today as a new patient for evaluation of recurrence of a bunion to the right foot.  Patient states that she had a bunionectomy procedure to her right foot when she was in her 28s approximately 30 years ago.  She says the bunion has now returned and has gotten significantly worse.  Is very painful and sensitive in shoes despite conservative treatments.  She presents for further treatment evaluation Patient also states that she has some thick discoloration to her toenails that occurred about a year ago when she received a pedicure.  She states that after the pedicure she began to notice discoloration and thickening of the nails.  She would like to have it evaluated and treated as well  Past Medical History:  Diagnosis Date  . 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 Urinary incontinence    Stress incontinence    Objective: Physical Exam General: The patient is alert and oriented x3 in no acute distress.  Dermatology: Skin is cool, dry and supple bilateral lower extremities. Negative for open lesions or macerations.  Hyperkeratotic dystrophic nails also noted to the right foot secondary to onychomycosis of the toenail  Vascular: Palpable pedal pulses bilaterally. No edema or erythema noted. Capillary refill within normal limits.  Neurological: Epicritic and protective threshold grossly intact bilaterally.   Musculoskeletal Exam: Clinical evidence of bunion deformity noted to the respective foot. There is moderate pain on palpation range of motion of the first MPJ. Lateral deviation of the hallux noted consistent with hallux abductovalgus. Hammertoe contracture also noted on clinical exam to digits #2 of the right foot. Symptomatic pain on palpation and range of motion also noted to the metatarsal phalangeal joints of the respective hammertoe digits.    Radiographic Exam: Increased intermetatarsal angle greater than 15 with a hallux abductus angle greater than  30 noted on AP view. Moderate degenerative changes noted within the first MPJ. Contracture deformity also noted to the interphalangeal joints and MPJs of the digits of the respective hammertoes.    Assessment: 1. HAV w/ bunion deformity right 2. Hammertoe deformity second digit right 3.  Onychomycosis of toenails right   Plan of Care:  1. Patient was evaluated. X-Rays reviewed. 2. Today we discussed the conservative versus surgical management of the presenting pathology. The patient opts for surgical management. All possible complications and details of the procedure were explained. All patient questions were answered. No guarantees were expressed or implied. 3. Authorization for surgery was initiated today. Surgery will consist of bunionectomy with osteotomy right.  Hammertoe arthroplasty with MTPJ capsulotomy second right.  Possible Weil shortening osteotomy second right. 4.  In regards to the onychomycosis of the toenails, we did discuss different treatment options including oral, topical, and laser antifungal treatment modalities.  The patient opts for oral antifungal treatment modalities.  She denies a history of liver pathology or symptoms 5.  Prescription for Lamisil 250 mg #90 daily 6.  Return to clinic 1 week postop  Felecia Shelling, DPM [facility]  Dr. Felecia Shelling, DPM    [address] [place], [place] [address]                Office [phone]  Fax [phone]

## 2020-09-10 ENCOUNTER — Telehealth: Payer: Self-pay | Admitting: Urology

## 2020-09-10 NOTE — Telephone Encounter (Signed)
DOS - 10/07/20  DOUBLE OSTEOTOMY RIGHT --- 20254 CAPSULOTOMY MPJ RELESE JOINT 2ND RIGHT --- 27062 HAMMERTOE REPAIR 2ND RIGHT --- 37628   [employer] EFFECTIVE DATE - 02/02/18   SPOKE WITH PHILYIS WITH [employer] AND SHE STATED THAT FOR CPT CODES 31517, 61607 AND  28285 NO PRIOR AUTH IS REQUIRED.  REF # [id number]

## 2020-09-20 DIAGNOSIS — N898 Other specified noninflammatory disorders of vagina: Secondary | ICD-10-CM | POA: Diagnosis not present

## 2020-09-21 DIAGNOSIS — M79676 Pain in unspecified toe(s): Secondary | ICD-10-CM

## 2020-10-07 ENCOUNTER — Other Ambulatory Visit: Payer: Self-pay | Admitting: Podiatry

## 2020-10-07 ENCOUNTER — Encounter: Payer: Self-pay | Admitting: Podiatry

## 2020-10-07 DIAGNOSIS — M25871 Other specified joint disorders, right ankle and foot: Secondary | ICD-10-CM | POA: Diagnosis not present

## 2020-10-07 DIAGNOSIS — M21541 Acquired clubfoot, right foot: Secondary | ICD-10-CM | POA: Diagnosis not present

## 2020-10-07 DIAGNOSIS — M25571 Pain in right ankle and joints of right foot: Secondary | ICD-10-CM | POA: Diagnosis not present

## 2020-10-07 DIAGNOSIS — M205X1 Other deformities of toe(s) (acquired), right foot: Secondary | ICD-10-CM | POA: Diagnosis not present

## 2020-10-07 DIAGNOSIS — M2041 Other hammer toe(s) (acquired), right foot: Secondary | ICD-10-CM | POA: Diagnosis not present

## 2020-10-07 DIAGNOSIS — M2011 Hallux valgus (acquired), right foot: Secondary | ICD-10-CM | POA: Diagnosis not present

## 2020-10-07 MED ORDER — IBUPROFEN 800 MG PO TABS: 800.0000 mg | ORAL_TABLET | Freq: Three times a day (TID) | ORAL | 1 refills | Status: DC

## 2020-10-07 MED ORDER — OXYCODONE-ACETAMINOPHEN 5-325 MG PO TABS: 1.0000 | ORAL_TABLET | ORAL | 0 refills | Status: AC | PRN

## 2020-10-07 NOTE — Progress Notes (Signed)
PRN postop 

## 2020-10-13 ENCOUNTER — Ambulatory Visit (INDEPENDENT_AMBULATORY_CARE_PROVIDER_SITE_OTHER): Payer: BC Managed Care – PPO

## 2020-10-13 ENCOUNTER — Other Ambulatory Visit: Payer: Self-pay

## 2020-10-13 ENCOUNTER — Ambulatory Visit (INDEPENDENT_AMBULATORY_CARE_PROVIDER_SITE_OTHER): Payer: BC Managed Care – PPO | Admitting: Podiatry

## 2020-10-13 DIAGNOSIS — Z9889 Other specified postprocedural states: Secondary | ICD-10-CM

## 2020-10-13 NOTE — Progress Notes (Signed)
   Subjective:  Patient presents today status post bunionectomy with hammertoe repair second digit right foot. DOS: 10/07/2020.  Patient states that she is doing very well.  She has minimal pain.  Currently she is not taking any pain medication.  She has been using the crutches for assistance.  No new complaints at this time  Past Medical History:  Diagnosis Date   Cervical dysplasia 2007   laser   Urinary incontinence    Stress incontinence      Objective/Physical Exam Neurovascular status intact.  Skin incisions appear to be well coapted with sutures and staples intact. No sign of infectious process noted. No dehiscence. No active bleeding noted. Moderate edema noted to the surgical extremity.  Radiographic Exam:  Orthopedic hardware and osteotomies sites appear to be stable with routine healing.  Percutaneous K wire fixation is intact  Assessment: 1. s/p bunionectomy with hammertoe repair second digit right foot. DOS: 10/07/2020   Plan of Care:  1. Patient was evaluated. X-rays reviewed 2.  Dressings changed today.  Keep clean dry and intact x1 week 3.  Continue minimal partial weightbearing in the postsurgical shoe with the assistance of crutches 5.  Return to clinic in 1 week for possible suture and staple removal   Felecia Shelling, DPM [facility]  Dr. Felecia Shelling, DPM    [address] [place], [place] [address]                Office [phone]  Fax [phone]

## 2020-10-20 ENCOUNTER — Ambulatory Visit (INDEPENDENT_AMBULATORY_CARE_PROVIDER_SITE_OTHER): Payer: BC Managed Care – PPO | Admitting: Podiatry

## 2020-10-20 ENCOUNTER — Other Ambulatory Visit: Payer: Self-pay

## 2020-10-20 ENCOUNTER — Encounter: Payer: BC Managed Care – PPO | Admitting: Podiatry

## 2020-10-20 DIAGNOSIS — Z9889 Other specified postprocedural states: Secondary | ICD-10-CM

## 2020-10-20 NOTE — Progress Notes (Signed)
   Subjective:  Patient presents today status post bunionectomy with hammertoe repair second digit right foot. DOS: 10/07/2020.  Patient is doing very well.  She is minimal weightbearing with the assistance of crutches.  Overall she has significant improvement in her pain.  No new complaints at this time  Past Medical History:  Diagnosis Date   Cervical dysplasia 2007   laser   Urinary incontinence    Stress incontinence      Objective/Physical Exam Neurovascular status intact.  Skin incisions appear to be well coapted with sutures and staples intact. No sign of infectious process noted. No dehiscence. No active bleeding noted. Moderate edema noted to the surgical extremity.  Radiographic Exam:  Orthopedic hardware and osteotomies sites appear to be stable with routine healing.  Percutaneous K wire fixation is intact  Assessment: 1. s/p bunionectomy with hammertoe repair second digit right foot. DOS: 10/07/2020   Plan of Care:  1. Patient was evaluated.  2.  Staples and sutures were removed today 3.  Continue minimal weightbearing in the postsurgical shoe with the assistance of crutches 4.  Return to clinic in 4 weeks for percutaneous pin removal and follow-up x-rays after removal of the pins   Felecia Shelling, DPM [facility]  Dr. Felecia Shelling, DPM    [address] [place], [place] [address]                Office [phone]  Fax [phone]

## 2020-11-03 ENCOUNTER — Encounter: Payer: BC Managed Care – PPO | Admitting: Podiatry

## 2020-11-15 ENCOUNTER — Other Ambulatory Visit: Payer: Self-pay

## 2020-11-15 ENCOUNTER — Ambulatory Visit (INDEPENDENT_AMBULATORY_CARE_PROVIDER_SITE_OTHER): Payer: BC Managed Care – PPO

## 2020-11-15 ENCOUNTER — Ambulatory Visit (INDEPENDENT_AMBULATORY_CARE_PROVIDER_SITE_OTHER): Payer: BC Managed Care – PPO | Admitting: Podiatry

## 2020-11-15 DIAGNOSIS — Z9889 Other specified postprocedural states: Secondary | ICD-10-CM

## 2020-11-15 NOTE — Progress Notes (Signed)
   Subjective:  Patient presents today status post bunionectomy with hammertoe repair second digit right foot. DOS: 10/07/2020.  Patient is doing very well.  No new complaints at this time Past Medical History:  Diagnosis Date   Cervical dysplasia 2007   laser   Urinary incontinence    Stress incontinence      Objective/Physical Exam Neurovascular status intact.  Skin incisions appear to be well coapted and healed. No sign of infectious process noted. No dehiscence. No active bleeding noted. Moderate edema noted to the surgical extremity.  Overall good alignment of the first and second rays  Radiographic Exam:  Orthopedic hardware and osteotomies sites appear to be stable with routine healing.  Percutaneous K wire fixation removed and absent from today's x-rays in their entirety  Assessment: 1. s/p bunionectomy with hammertoe repair second digit right foot. DOS: 10/07/2020   Plan of Care:  1. Patient was evaluated.  2.  Percutaneous fixation pins removed 3.  Patient may begin to transition of the postsurgical shoe into good supportive sneakers 4.  Return to clinic 1 month   Felecia Shelling, DPM [facility]  Dr. Felecia Shelling, DPM    [address] [place], [place] [address]                Office [phone]  Fax [phone]

## 2020-12-12 ENCOUNTER — Other Ambulatory Visit: Payer: Self-pay | Admitting: Podiatry

## 2020-12-22 ENCOUNTER — Ambulatory Visit (INDEPENDENT_AMBULATORY_CARE_PROVIDER_SITE_OTHER): Payer: BC Managed Care – PPO | Admitting: Podiatry

## 2020-12-22 ENCOUNTER — Ambulatory Visit (INDEPENDENT_AMBULATORY_CARE_PROVIDER_SITE_OTHER): Payer: BC Managed Care – PPO

## 2020-12-22 ENCOUNTER — Other Ambulatory Visit: Payer: Self-pay

## 2020-12-22 DIAGNOSIS — Z9889 Other specified postprocedural states: Secondary | ICD-10-CM | POA: Diagnosis not present

## 2020-12-22 NOTE — Progress Notes (Signed)
   Subjective:  Patient presents today status post bunionectomy with hammertoe repair second digit right foot. DOS: 10/07/2020.  Patient is doing very well.  No new complaints at this time Past Medical History:  Diagnosis Date   Cervical dysplasia 2007   laser   Urinary incontinence    Stress incontinence      Objective/Physical Exam Neurovascular status intact.  Skin incisions appear to be well coapted and healed. No sign of infectious process noted. No dehiscence. No active bleeding noted.  Minimal edema noted to the surgical extremity.  Overall good alignment of the first and second rays.  There is some limited range of motion of the first and second MTPJ  Radiographic Exam:  Orthopedic hardware and osteotomies sites appear to be stable with routine healing.  Good alignment overall of the first and second ray.  Assessment: 1. s/p bunionectomy with hammertoe repair second digit right foot. DOS: 10/07/2020   Plan of Care:  1. Patient was evaluated.  2.  Patient may now begin full activity no restrictions.  Recommend good supportive shoes and sneakers 3.  Compression ankle sleeve dispensed.  Wear daily as needed  4.  Recommend daily range of motion exercises to the first and second MTPJ  5.  Return to clinic as needed.   Felecia Shelling, DPM [facility]  Dr. Felecia Shelling, DPM    [address] [place], [place] [address]                Office [phone]  Fax [phone]

## 2021-01-31 DIAGNOSIS — H8110 Benign paroxysmal vertigo, unspecified ear: Secondary | ICD-10-CM | POA: Diagnosis not present

## 2021-03-09 ENCOUNTER — Other Ambulatory Visit: Payer: Self-pay | Admitting: Podiatry

## 2021-06-29 DIAGNOSIS — J069 Acute upper respiratory infection, unspecified: Secondary | ICD-10-CM | POA: Diagnosis not present

## 2021-07-01 ENCOUNTER — Other Ambulatory Visit: Payer: Self-pay | Admitting: Podiatry

## 2021-08-26 DIAGNOSIS — E669 Obesity, unspecified: Secondary | ICD-10-CM | POA: Diagnosis not present

## 2021-08-26 DIAGNOSIS — Z0000 Encounter for general adult medical examination without abnormal findings: Secondary | ICD-10-CM | POA: Diagnosis not present

## 2021-08-26 DIAGNOSIS — E785 Hyperlipidemia, unspecified: Secondary | ICD-10-CM | POA: Diagnosis not present

## 2021-08-26 DIAGNOSIS — M19041 Primary osteoarthritis, right hand: Secondary | ICD-10-CM | POA: Diagnosis not present

## 2021-08-30 ENCOUNTER — Other Ambulatory Visit: Payer: Self-pay | Admitting: Internal Medicine

## 2021-08-30 DIAGNOSIS — Z1231 Encounter for screening mammogram for malignant neoplasm of breast: Secondary | ICD-10-CM

## 2021-08-31 DIAGNOSIS — M25562 Pain in left knee: Secondary | ICD-10-CM | POA: Diagnosis not present

## 2021-09-08 ENCOUNTER — Ambulatory Visit
Admission: RE | Admit: 2021-09-08 | Discharge: 2021-09-08 | Disposition: A | Discharge status: Home / Self Care | Payer: BC Managed Care – PPO | Source: Ambulatory Visit | Attending: Internal Medicine | Admitting: Internal Medicine

## 2021-09-08 DIAGNOSIS — Z1231 Encounter for screening mammogram for malignant neoplasm of breast: Secondary | ICD-10-CM

## 2021-10-11 ENCOUNTER — Ambulatory Visit: Payer: BC Managed Care – PPO | Admitting: Orthopaedic Surgery

## 2022-04-07 DIAGNOSIS — Z1389 Encounter for screening for other disorder: Secondary | ICD-10-CM | POA: Diagnosis not present

## 2022-04-07 DIAGNOSIS — Z113 Encounter for screening for infections with a predominantly sexual mode of transmission: Secondary | ICD-10-CM | POA: Diagnosis not present

## 2022-04-07 DIAGNOSIS — Z780 Asymptomatic menopausal state: Secondary | ICD-10-CM | POA: Diagnosis not present

## 2022-04-07 DIAGNOSIS — Z202 Contact with and (suspected) exposure to infections with a predominantly sexual mode of transmission: Secondary | ICD-10-CM | POA: Diagnosis not present

## 2022-04-07 DIAGNOSIS — Z01419 Encounter for gynecological examination (general) (routine) without abnormal findings: Secondary | ICD-10-CM | POA: Diagnosis not present

## 2022-04-07 DIAGNOSIS — Z8632 Personal history of gestational diabetes: Secondary | ICD-10-CM | POA: Diagnosis not present

## 2022-04-18 DIAGNOSIS — N939 Abnormal uterine and vaginal bleeding, unspecified: Secondary | ICD-10-CM | POA: Diagnosis not present

## 2022-05-30 DIAGNOSIS — Z1211 Encounter for screening for malignant neoplasm of colon: Secondary | ICD-10-CM | POA: Diagnosis not present

## 2022-05-30 DIAGNOSIS — K5900 Constipation, unspecified: Secondary | ICD-10-CM | POA: Diagnosis not present

## 2022-05-30 DIAGNOSIS — E669 Obesity, unspecified: Secondary | ICD-10-CM | POA: Diagnosis not present

## 2022-05-31 DIAGNOSIS — R748 Abnormal levels of other serum enzymes: Secondary | ICD-10-CM | POA: Diagnosis not present

## 2022-06-16 DIAGNOSIS — Z1231 Encounter for screening mammogram for malignant neoplasm of breast: Secondary | ICD-10-CM | POA: Diagnosis not present

## 2022-08-11 DIAGNOSIS — K6289 Other specified diseases of anus and rectum: Secondary | ICD-10-CM | POA: Diagnosis not present

## 2022-08-11 DIAGNOSIS — Z1211 Encounter for screening for malignant neoplasm of colon: Secondary | ICD-10-CM | POA: Diagnosis not present

## 2023-04-09 DIAGNOSIS — Z01411 Encounter for gynecological examination (general) (routine) with abnormal findings: Secondary | ICD-10-CM | POA: Diagnosis not present

## 2023-04-09 DIAGNOSIS — N8110 Cystocele, unspecified: Secondary | ICD-10-CM | POA: Diagnosis not present

## 2023-04-09 DIAGNOSIS — D259 Leiomyoma of uterus, unspecified: Secondary | ICD-10-CM | POA: Diagnosis not present

## 2023-04-09 DIAGNOSIS — R102 Pelvic and perineal pain: Secondary | ICD-10-CM | POA: Diagnosis not present

## 2023-04-09 DIAGNOSIS — Z130 Encounter for screening for diseases of the blood and blood-forming organs and certain disorders involving the immune mechanism: Secondary | ICD-10-CM | POA: Diagnosis not present

## 2023-04-09 DIAGNOSIS — N951 Menopausal and female climacteric states: Secondary | ICD-10-CM | POA: Diagnosis not present

## 2023-07-11 ENCOUNTER — Other Ambulatory Visit: Payer: Self-pay

## 2023-07-11 ENCOUNTER — Ambulatory Visit: Payer: BC Managed Care – PPO | Attending: Obstetrics and Gynecology

## 2023-07-11 DIAGNOSIS — R293 Abnormal posture: Secondary | ICD-10-CM | POA: Diagnosis not present

## 2023-07-11 DIAGNOSIS — M6281 Muscle weakness (generalized): Secondary | ICD-10-CM | POA: Diagnosis not present

## 2023-07-11 DIAGNOSIS — M5459 Other low back pain: Secondary | ICD-10-CM | POA: Diagnosis not present

## 2023-07-11 DIAGNOSIS — M62838 Other muscle spasm: Secondary | ICD-10-CM | POA: Insufficient documentation

## 2023-07-11 DIAGNOSIS — R279 Unspecified lack of coordination: Secondary | ICD-10-CM | POA: Diagnosis not present

## 2023-07-11 NOTE — Therapy (Signed)
 OUTPATIENT PHYSICAL THERAPY FEMALE PELVIC EVALUATION   Patient Name: Misty Bates MRN: [id number] DOB:1970-11-10, 53 y.o., female Today's Date: 07/11/2023  END OF SESSION:  PT End of Session - 07/11/23 0930     Visit Number 1    Date for PT Re-Evaluation 12/26/23    Authorization Type BCBS    PT Start Time 0930    PT Stop Time 1010    PT Time Calculation (min) 40 min    Activity Tolerance Patient tolerated treatment well    Behavior During Therapy [facility] for tasks assessed/performed             Past Medical History:  Diagnosis Date   Cervical dysplasia 2007   laser   Urinary incontinence    Stress incontinence   Past Surgical History:  Procedure Laterality Date   COLPOSCOPY     FOOT SURGERY     LASER ABLATION OF THE CERVIX     OPEN REDUCTION INTERNAL FIXATION (ORIF) DISTAL RADIAL FRACTURE Right 05/18/2016   Procedure: OPEN REDUCTION INTERNAL FIXATION RIGHT FOREARM BOTH BONE FRACTURE, OPEN REDUCTION INTERNAL FIXATION RIGHT INDEX FINGER;  Surgeon: Mack Hook, MD;  Location: [facility] OR;  Service: Orthopedics;  Laterality: Right;   TUBAL LIGATION     Patient Active Problem List   Diagnosis Date Noted   Transaminitis 05/20/2017    PCP: Azucena Fallen, PA  REFERRING PROVIDER: Sherian Rein, MD   REFERRING DIAG: N39.3 (ICD-10-CM) - Stress incontinence (female) (female)  THERAPY DIAG:  Muscle weakness (generalized)  Other muscle spasm  Unspecified lack of coordination  Abnormal posture  Other low back pain  Rationale for Evaluation and Treatment: Rehabilitation  ONSET DATE: 16 years   SUBJECTIVE:                                                                                                                                                                                           SUBJECTIVE STATEMENT: Pt states that she started having issues after having last child 16 years ago.  Fluid intake: 8 glasses of water, some alcohol  PAIN:  Are you  having pain? Yes NPRS scale: 8/10 Pain location:  low back pain  Pain type: aching and tight Pain description: intermittent   Aggravating factors: walking, standing on hard floor  Relieving factors: sitting, rest  PRECAUTIONS: None  RED FLAGS: None   WEIGHT BEARING RESTRICTIONS: No  FALLS:  Has patient fallen in last 6 months? No  OCCUPATION: [employer]   ACTIVITY LEVEL : standing at work all day  PLOF: Independent  PATIENT GOALS: better bladder control  PERTINENT HISTORY:  3 vaginal deliveries, tubal  ligation  BOWEL MOVEMENT: Pain with bowel movement: No Type of bowel movement:Frequency 1x/day and Strain no Fully empty rectum:  Leakage: No Pads: No Fiber supplement/laxative Yes   URINATION: Pain with urination: No Fully empty bladder: Yes: but post void dribbling Stream: Strong Urgency: Yes  Frequency: at least every 2 hours Leakage: Coughing, Sneezing, and Laughing Pads: No, not unless she gets sick  INTERCOURSE:  Ability to have vaginal penetration Yes  Pain with intercourse: none DrynessYes  Climax: WNL Marinoff Scale: 0/3 Lubricant: no  PREGNANCY: Vaginal deliveries 3 Tearing Yes: with first, sutures  Episiotomy No C-section deliveries 0 Currently pregnant No  PROLAPSE: None   OBJECTIVE:  Note: Objective measures were completed at Evaluation unless otherwise noted.  07/11/23:  PATIENT SURVEYS:  PFIQ-7: 79  COGNITION: Overall cognitive status: Within functional limits for tasks assessed     SENSATION: Light touch: Appears intact   FUNCTIONAL TESTS:  Squat: bil valgus knee collapse Single leg stance:  Rt: pelvic drop  ZO:[id number] drop Curl-up test:2 finger width diastasis with upper abdominal distortion   GAIT: Assistive device utilized: None Comments: WNL  POSTURE: rounded shoulders, increased lumbar lordosis, increased thoracic kyphosis, and anterior pelvic tilt   LUMBARAROM/PROM:  A/PROM A/PROM  Eval (%  available)  Flexion 60  Extension 25, pain  Right lateral flexion 50  Left lateral flexion 50  Right rotation 50  Left rotation 50   (Blank rows = not tested)   PALPATION:   General: tension and tenderness in low back and bil hips  Pelvic Alignment: Rt posterior rotation  Abdominal: no tenderness                External Perineal Exam: some labial fusion                             Internal Pelvic Floor: some pelvic floor muscle atrophy  Patient confirms identification and approves PT to assess internal pelvic floor and treatment Yes  PELVIC MMT:   MMT eval  Vaginal 2/5, 2 second endurance, 7 repeat contractions  Diastasis Recti 2 finger widths   (Blank rows = not tested)        TONE: WNL  PROLAPSE: Grade 2 anterior vaginal wall laxity  TODAY'S TREATMENT:                                                                                                                              DATE:  07/11/23  EVAL  Neuromuscular re-education: Pt provides verbal consent for internal vaginal/rectal pelvic floor exam. Internal vaginal pelvic floor muscle contraction training Quick flicks Long holds Urge drill The knack Therapeutic activities: Double voiding lubricants   PATIENT EDUCATION:  Education details: See above Person educated: Patient Education method: Explanation, Demonstration, Tactile cues, Verbal cues, and Handouts Education comprehension: verbalized understanding  HOME EXERCISE PROGRAM: [id number]  ASSESSMENT:  CLINICAL IMPRESSION: Patient is a 53 y.o. female who  was seen today for physical therapy evaluation and treatment for urinary incontinence and low back pain. Exam findings notable for decreased lumbar A/ROM, abnormal posture, decreased functional core/hip strength in single leg stance and squat, decreased core strength with diastasis recti abdominus and abdominal distortion with increased pressure, pelvic floor muscle weakness, decreased pelvic floor  muscle endurance, and anterior vaginal wall laxity. Signs and symptoms are most consistent with pelvic floor muscle weakness, overall deconditioning and stiffness, and poor pressure management. Initial treatment consisted of pelvic floor muscle contraction training, the knack, urge drill, double voiding, and education of using lubricants during intercourse. She will continue to benefit from skilled PT intervention in order to improve bladder control, decrease low back pain, improve abdominal pressure management, and begin/progress functional strengthening program.   OBJECTIVE IMPAIRMENTS: Abnormal gait, decreased activity tolerance, decreased coordination, decreased knowledge of condition, decreased mobility, decreased strength, increased fascial restrictions, increased muscle spasms, impaired tone, postural dysfunction, and pain.   ACTIVITY LIMITATIONS: carrying, lifting, bending, standing, continence, and locomotion level  PARTICIPATION LIMITATIONS: community activity and occupation  PERSONAL FACTORS: 1 comorbidity: medical history  are also affecting patient's functional outcome.   REHAB POTENTIAL: Good  CLINICAL DECISION MAKING: Stable/uncomplicated  EVALUATION COMPLEXITY: Low   GOALS: Goals reviewed with patient? Yes  SHORT TERM GOALS: Target date: 08/08/2023   Pt will be independent with advanced HEP.   Baseline: Goal status: INITIAL  2.  Pt will report 25% improvement in urinary incontinence.  Baseline:  Goal status: INITIAL  3.  Pt will be independent with the knack, urge suppression technique, and double voiding in order to improve bladder habits and decrease urinary incontinence.   Baseline:  Goal status: INITIAL  4.  Pt will be able to correctly perform diaphragmatic breathing and appropriate pressure management in order to prevent worsening vaginal wall laxity and improve pelvic floor A/ROM.   Baseline:  Goal status: INITIAL  5.  Pt will report 25% improvement in  low back pain with standing and walking at work.  Baseline:  Goal status: INITIAL    LONG TERM GOALS: Target date: 12/26/23  Pt will be independent with advanced HEP.   Baseline:  Goal status: INITIAL  2.  Pt will report 75% improvement in urinary incontinence.  Baseline:  Goal status: INITIAL  3.  Pt will report no leaks with laughing, coughing, sneezing in order to improve comfort with interpersonal relationships and community activities.   Baseline:  Goal status: INITIAL  4.  Pt will be able to go 2-3 hours in between voids without urgency or incontinence in order to improve QOL and perform all functional activities with less difficulty.   Baseline:  Goal status: INITIAL  5.  Pt will report 75% improvement in low back pain with standing and walking at work.  Baseline:  Goal status: INITIAL  6.  Pt will demonstrate 20 point improvement in PFIQ-7 score in order to show functional improvement in urinary incontinence.  Baseline: current score 57 Goal status: INITIAL  PLAN:  PT FREQUENCY: 1-2x/week  PT DURATION: 6 months  PLANNED INTERVENTIONS: 97110-Therapeutic exercises, 97530- Therapeutic activity, 97112- Neuromuscular re-education, 97535- Self Care, 16109- Manual therapy, Dry Needling, and Biofeedback  PLAN FOR NEXT SESSION: Begin core training.    Julio Alm, PT, DPT03/26/2510:42 AM

## 2023-07-11 NOTE — Patient Instructions (Signed)
 The knack: Use this technique while coughing, laughing, sneezing, or with any activities that causes you to leak urine a little. Right before you perform one of these activities that increase pressure in the abdomen and pushes a little urine out, perform a pelvic floor muscle contraction and hold. If that does not completely stop the leaking, try tightening your thighs together in addition to performing a pelvic floor muscle contraction. Make sure you are not trying to stifle a cough, sneeze, or laugh; allow these activities in full as it will cause less pressure down into the bladder and pelvic floor muscles.    Urge Incontinence  Ideal urination frequency is every 2-4 wakeful hours, which equates to 5-8 times within a 24-hour period.   Urge incontinence is leakage that occurs when the bladder muscle contracts, creating a sudden need to go before getting to the bathroom.   Going too often when your bladder isn't actually full can disrupt the body's automatic signals to store and hold urine longer, which will increase urgency/frequency.  In this case, the bladder "is running the show" and strategies can be learned to retrain this pattern.   One should be able to control the first urge to urinate, at around .  The bladder can hold up to a "grande latte," or . To help you gain control, practice the Urge Drill below when urgency strikes.  This drill will help retrain your bladder signals and allow you to store and hold urine longer.  The overall goal is to stretch out your time between voids to reach a more manageable voiding schedule.    Practice your "quick flicks" often throughout the day (each waking hour) even when you don't need feel the urge to go.  This will help strengthen your pelvic floor muscles, making them more effective in controlling leakage.  Urge Drill  When you feel an urge to go, follow these steps to regain control: Stop what you are doing and be still Take one  deep breath, directing your air into your abdomen Think an affirming thought, such as "I've got this." Do 5 quick flicks of your pelvic floor Walk with control to the bathroom to void, or delay voiding      Double-voiding:  This technique is to help with post-void dribbling, or leaking a little bit when you stand up right after urinating. Use relaxed toileting mechanics to urinate as much as you feel like you have to without straining. Sit back upright from leaning forward and relax this way for 10-20 seconds. Lean forward again to finish voiding any amount more.    Lubrication Used for intercourse to reduce friction Avoid ones that have glycerin, nonoxynol-9, petroleum, propylene glycol, chlorhexidine gluconate, warming gels, tingling gels, icing or cooling gel, scented Avoid parabens due to a preservative similar to female sex hormone May need to be reapplied once or several times during sexual activity Can be applied to both partners genitals prior to vaginal penetration to minimize friction or irritation Prevent irritation and mucosal tears that cause post coital pain and increased the risk of vaginal and urinary tract infections Oil-based lubricants cannot be used with condoms due to breaking them down.  Least likely to irritate vaginal tissue.  Plant based-lubes are safe Silicone-based lubrication are thicker and last long and used for post-menopausal women  Vaginal Lubricators Here is a list of some suggested lubricators you can use for intercourse. Use the most hypoallergenic product.  You can place on you or your partner.  Slippery Stuff ( water based) Sylk or Sliquid Natural H2O ( good  if frequent UTI's)- walmart, amazon Sliquid organics silk-(aloe and silicone based ) [employer] (www.blossom-organics.com)- (aloe based ) Coconut oil, olive oil -not good with condoms  PJur Woman Nude- (water based) amazon Uberlube- ( silicon) Amazon Aloe Vera- Sprouts has an  organic one Yes lubricant- (water based and has plant oil based similar to silicone) [employer] Platinum-Silicone, Target, [facility] Olive and Bee intimate cream-  www.oliveandbee.com.au Pink - [employer] Erosense Sync- walmart, amazon Coconu- coconu.com [employer] Good Clean Love lubricants  Things to avoid in lubricants are glycerin, warming gels, tingling gels, icing or cooling  gels, and scented gels.  Also avoid Vaseline. KY jelly,  and Astroglide contain chlorhexidine which kills good bacteria(lactobacilli)  Things to avoid in the vaginal area Do not use things to irritate the vulvar area No lotions- see below Soaps you  can use :Aveeno, [place], Good Clean Love cleanser if needed. Must be gentle No deodorants No douches Good to sleep without underwear to let the vaginal area to air out No scrubbing: spread the lips to let warm water rinse over labias and pat dry  Creams that can be used on the Vulva Area V [employer], walmart Vital V Wild Yam Salve Julva- [employer] Botanical Pro-Meno Wild Yam Cream Coconut oil, olive oil Cleo by [employer] labial moisturizer -[employer],  [place]ert [place] Releveum ( lidocaine) or [employer] Gele Yes [facility] [address], [address] [place], [place] [address] Phone # [phone] Fax [phone]

## 2023-07-18 ENCOUNTER — Ambulatory Visit: Payer: BC Managed Care – PPO | Attending: Obstetrics and Gynecology

## 2023-07-18 DIAGNOSIS — R293 Abnormal posture: Secondary | ICD-10-CM | POA: Insufficient documentation

## 2023-07-18 DIAGNOSIS — M62838 Other muscle spasm: Secondary | ICD-10-CM | POA: Diagnosis not present

## 2023-07-18 DIAGNOSIS — M6281 Muscle weakness (generalized): Secondary | ICD-10-CM | POA: Insufficient documentation

## 2023-07-18 DIAGNOSIS — M5459 Other low back pain: Secondary | ICD-10-CM | POA: Diagnosis not present

## 2023-07-18 DIAGNOSIS — R279 Unspecified lack of coordination: Secondary | ICD-10-CM | POA: Insufficient documentation

## 2023-07-18 NOTE — Therapy (Signed)
 OUTPATIENT PHYSICAL THERAPY FEMALE PELVIC TREATMENT   Patient Name: Misty Bates MRN: [id number] DOB:January 12, 1971, 53 y.o., female Today's Date: 07/18/2023  END OF SESSION:  PT End of Session - 07/18/23 0936     Visit Number 2    Date for PT Re-Evaluation 12/26/23    Authorization Type BCBS    PT Start Time 0930    PT Stop Time 1010    PT Time Calculation (min) 40 min    Activity Tolerance Patient tolerated treatment well    Behavior During Therapy [facility] for tasks assessed/performed              Past Medical History:  Diagnosis Date   Cervical dysplasia 2007   laser   Urinary incontinence    Stress incontinence   Past Surgical History:  Procedure Laterality Date   COLPOSCOPY     FOOT SURGERY     LASER ABLATION OF THE CERVIX     OPEN REDUCTION INTERNAL FIXATION (ORIF) DISTAL RADIAL FRACTURE Right 05/18/2016   Procedure: OPEN REDUCTION INTERNAL FIXATION RIGHT FOREARM BOTH BONE FRACTURE, OPEN REDUCTION INTERNAL FIXATION RIGHT INDEX FINGER;  Surgeon: Mack Hook, MD;  Location: [facility] OR;  Service: Orthopedics;  Laterality: Right;   TUBAL LIGATION     Patient Active Problem List   Diagnosis Date Noted   Transaminitis 05/20/2017    PCP: Azucena Fallen, PA  REFERRING PROVIDER: Sherian Rein, MD   REFERRING DIAG: N39.3 (ICD-10-CM) - Stress incontinence (female) (female)  THERAPY DIAG:  Muscle weakness (generalized)  Other muscle spasm  Unspecified lack of coordination  Abnormal posture  Other low back pain  Rationale for Evaluation and Treatment: Rehabilitation  ONSET DATE: 16 years   SUBJECTIVE:                                                                                                                                                                                           SUBJECTIVE STATEMENT: Pt states that she is having issues coordinating breathing with pelvic floor muscles. Pt states that double voiding and the urge drill have been very  helpful.   PAIN: 07/18/23 Are you having pain? Yes NPRS scale: 4-5/10 Pain location:  low back pain  Pain type: aching and tight Pain description: intermittent   Aggravating factors: walking, standing on hard floor  Relieving factors: sitting, rest  PRECAUTIONS: None  RED FLAGS: None   WEIGHT BEARING RESTRICTIONS: No  FALLS:  Has patient fallen in last 6 months? No  OCCUPATION: [employer]   ACTIVITY LEVEL : standing at work all day  PLOF: Independent  PATIENT GOALS: better bladder control  PERTINENT HISTORY:  3 vaginal deliveries, tubal ligation  BOWEL MOVEMENT: Pain with bowel movement: No Type of bowel movement:Frequency 1x/day and Strain no Fully empty rectum:  Leakage: No Pads: No Fiber supplement/laxative Yes   URINATION: Pain with urination: No Fully empty bladder: Yes: but post void dribbling Stream: Strong Urgency: Yes  Frequency: at least every 2 hours Leakage: Coughing, Sneezing, and Laughing Pads: No, not unless she gets sick  INTERCOURSE:  Ability to have vaginal penetration Yes  Pain with intercourse: none DrynessYes  Climax: WNL Marinoff Scale: 0/3 Lubricant: no  PREGNANCY: Vaginal deliveries 3 Tearing Yes: with first, sutures  Episiotomy No C-section deliveries 0 Currently pregnant No  PROLAPSE: None   OBJECTIVE:  Note: Objective measures were completed at Evaluation unless otherwise noted.  07/11/23:  PATIENT SURVEYS:  PFIQ-7: 60  COGNITION: Overall cognitive status: Within functional limits for tasks assessed     SENSATION: Light touch: Appears intact   FUNCTIONAL TESTS:  Squat: bil valgus knee collapse Single leg stance:  Rt: pelvic drop  WU:[id number] drop Curl-up test:2 finger width diastasis with upper abdominal distortion   GAIT: Assistive device utilized: None Comments: WNL  POSTURE: rounded shoulders, increased lumbar lordosis, increased thoracic kyphosis, and anterior pelvic  tilt   LUMBARAROM/PROM:  A/PROM A/PROM  Eval (% available)  Flexion 60  Extension 25, pain  Right lateral flexion 50  Left lateral flexion 50  Right rotation 50  Left rotation 50   (Blank rows = not tested)   PALPATION:   General: tension and tenderness in low back and bil hips  Pelvic Alignment: Rt posterior rotation  Abdominal: no tenderness                External Perineal Exam: some labial fusion                             Internal Pelvic Floor: some pelvic floor muscle atrophy  Patient confirms identification and approves PT to assess internal pelvic floor and treatment Yes  PELVIC MMT:   MMT eval  Vaginal 2/5, 2 second endurance, 7 repeat contractions  Diastasis Recti 2 finger widths   (Blank rows = not tested)        TONE: WNL  PROLAPSE: Grade 2 anterior vaginal wall laxity  TODAY'S TREATMENT:                                                                                                                              DATE:  07/18/23 Neuromuscular re-education: Transversus abdominus training with multimodal cues for improved motor control and breath coordination Transversus abdominus isometrics 10x  Bil supine UE ball press with transversus abdominus and pelvic floor muscle contractions and breath coordination 10x Supine hip adduction ball press with transversus abdominus and pelvic floor muscle contractions and breath coordination 10x Bridge with hip adduction, transversus abdominus, and pelvic floor muscle 2 x 5 Seated hip adduction ball press with transversus  abdominus and pelvic floor muscle 2 x 10 Seated hip abduction red band with transversus abdominus and pelvic floor muscle 2 x 10 Seated resisted march red band with transversus abdominus and pelvic floor muscle 2 x 10 Exercises: Lower trunk rotation 2 x 10 Single knee to chest 10x bil Open books 10x bil Seated piriformis stretch  07/11/23  EVAL  Neuromuscular re-education: Pt provides verbal  consent for internal vaginal/rectal pelvic floor exam. Internal vaginal pelvic floor muscle contraction training Quick flicks Long holds Urge drill The knack Therapeutic activities: Double voiding lubricants   PATIENT EDUCATION:  Education details: See above Person educated: Patient Education method: Explanation, Demonstration, Tactile cues, Verbal cues, and Handouts Education comprehension: verbalized understanding  HOME EXERCISE PROGRAM: [id number]  ASSESSMENT:  CLINICAL IMPRESSION: Pt has seen some good initial improvements with bladder emptying and urgency with use of double voiding and urge drill. She feels like it's hard to coordinate the breathing with contractions, but she did well with this today during all exercises with minimal verbal cues.  She did well with core training and was able to progress to more challenging activities. She did fatigue easily and we discussed that she may have some residual soreness. She will continue to benefit from skilled PT intervention in order to improve bladder control, decrease low back pain, improve abdominal pressure management, and begin/progress functional strengthening program.   OBJECTIVE IMPAIRMENTS: Abnormal gait, decreased activity tolerance, decreased coordination, decreased knowledge of condition, decreased mobility, decreased strength, increased fascial restrictions, increased muscle spasms, impaired tone, postural dysfunction, and pain.   ACTIVITY LIMITATIONS: carrying, lifting, bending, standing, continence, and locomotion level  PARTICIPATION LIMITATIONS: community activity and occupation  PERSONAL FACTORS: 1 comorbidity: medical history  are also affecting patient's functional outcome.   REHAB POTENTIAL: Good  CLINICAL DECISION MAKING: Stable/uncomplicated  EVALUATION COMPLEXITY: Low   GOALS: Goals reviewed with patient? Yes  SHORT TERM GOALS: Target date: 08/08/2023   Pt will be independent with advanced HEP.    Baseline: Goal status: INITIAL  2.  Pt will report 25% improvement in urinary incontinence.  Baseline:  Goal status: INITIAL  3.  Pt will be independent with the knack, urge suppression technique, and double voiding in order to improve bladder habits and decrease urinary incontinence.   Baseline:  Goal status: INITIAL  4.  Pt will be able to correctly perform diaphragmatic breathing and appropriate pressure management in order to prevent worsening vaginal wall laxity and improve pelvic floor A/ROM.   Baseline:  Goal status: INITIAL  5.  Pt will report 25% improvement in low back pain with standing and walking at work.  Baseline:  Goal status: INITIAL    LONG TERM GOALS: Target date: 12/26/23  Pt will be independent with advanced HEP.   Baseline:  Goal status: INITIAL  2.  Pt will report 75% improvement in urinary incontinence.  Baseline:  Goal status: INITIAL  3.  Pt will report no leaks with laughing, coughing, sneezing in order to improve comfort with interpersonal relationships and community activities.   Baseline:  Goal status: INITIAL  4.  Pt will be able to go 2-3 hours in between voids without urgency or incontinence in order to improve QOL and perform all functional activities with less difficulty.   Baseline:  Goal status: INITIAL  5.  Pt will report 75% improvement in low back pain with standing and walking at work.  Baseline:  Goal status: INITIAL  6.  Pt will demonstrate 20 point improvement in PFIQ-7 score in  order to show functional improvement in urinary incontinence.  Baseline: current score 57 Goal status: INITIAL  PLAN:  PT FREQUENCY: 1-2x/week  PT DURATION: 6 months  PLANNED INTERVENTIONS: 97110-Therapeutic exercises, 97530- Therapeutic activity, 97112- Neuromuscular re-education, 97535- Self Care, [id number]- Manual therapy, Dry Needling, and Biofeedback  PLAN FOR NEXT SESSION: Progress core training.    Julio Alm, PT,  DPT04/05/2508:14 AM

## 2023-07-25 ENCOUNTER — Ambulatory Visit: Payer: BC Managed Care – PPO

## 2023-07-25 DIAGNOSIS — R293 Abnormal posture: Secondary | ICD-10-CM

## 2023-07-25 DIAGNOSIS — M5459 Other low back pain: Secondary | ICD-10-CM

## 2023-07-25 DIAGNOSIS — M6281 Muscle weakness (generalized): Secondary | ICD-10-CM | POA: Diagnosis not present

## 2023-07-25 DIAGNOSIS — R279 Unspecified lack of coordination: Secondary | ICD-10-CM

## 2023-07-25 DIAGNOSIS — M62838 Other muscle spasm: Secondary | ICD-10-CM | POA: Diagnosis not present

## 2023-07-25 NOTE — Therapy (Signed)
 OUTPATIENT PHYSICAL THERAPY FEMALE PELVIC TREATMENT   Patient Name: Misty Bates MRN: [id number] DOB:[birth year], 53 y.o., female Today's Date: 07/25/2023  END OF SESSION:  PT End of Session - 07/25/23 1007     Visit Number 3    Date for PT Re-Evaluation 12/26/23    Authorization Type BC[employer]    PT Start Time 0930    PT Stop Time 1010    PT Time Calculation (min) 40 min    Activity Tolerance Patient tolerated treatment well    Behavior During Therapy WFL for tasks assessed/performed               Past Medical History:  Diagnosis Date   Cervical dysplasia 2007   laser   Urinary incontinence    Stress incontinence   Past Surgical History:  Procedure Laterality Date   COLPOSCOPY     FOOT SURGERY     LASER ABLATION OF THE CERVIX     OPEN REDUCTION INTERNAL FIXATION (ORIF) DISTAL RADIAL FRACTURE Right 05/18/2016   Procedure: OPEN REDUCTION INTERNAL FIXATION RIGHT FOREARM BOTH BONE FRACTURE, OPEN REDUCTION INTERNAL FIXATION RIGHT INDEX FINGER;  Surgeon: Mack Hook, MD;  Location: [facility] OR;  Service: Orthopedics;  Laterality: Right;   TUBAL LIGATION     Patient Active Problem List   Diagnosis Date Noted   Transaminitis 05/20/2017    PCP: Azucena Fallen, PA  REFERRING PROVIDER: Sherian Rein, MD   REFERRING DIAG: N39.3 (ICD-10-CM) - Stress incontinence (female) (female)  THERAPY DIAG:  Muscle weakness (generalized)  Other muscle spasm  Unspecified lack of coordination  Abnormal posture  Other low back pain  Rationale for Evaluation and Treatment: Rehabilitation  ONSET DATE: 16 years   SUBJECTIVE:                                                                                                                                                                                           SUBJECTIVE STATEMENT: Pt states that her back feels good this morning, but once she is standing at work it will get up to a 5/10. She reports good improvements with  bladder control and she is not leaking with coughing/sneezing.   PAIN: 492/25 Are you having pain? Yes NPRS scale: 0/10 Pain location:  low back pain  Pain type: aching and tight Pain description: intermittent   Aggravating factors: walking, standing on hard floor  Relieving factors: sitting, rest  PRECAUTIONS: None  RED FLAGS: None   WEIGHT BEARING RESTRICTIONS: No  FALLS:  Has patient fallen in last 6 months? No  OCCUPATION: [employer]   ACTIVITY LEVEL : standing at work all day  PLOF: Independent  PATIENT GOALS: better bladder control  PERTINENT HISTORY:  3 vaginal deliveries, tubal ligation  BOWEL MOVEMENT: Pain with bowel movement: No Type of bowel movement:Frequency 1x/day and Strain no Fully empty rectum:  Leakage: No Pads: No Fiber supplement/laxative Yes   URINATION: Pain with urination: No Fully empty bladder: Yes: but post void dribbling Stream: Strong Urgency: Yes  Frequency: at least every 2 hours Leakage: Coughing, Sneezing, and Laughing Pads: No, not unless she gets sick  INTERCOURSE:  Ability to have vaginal penetration Yes  Pain with intercourse: none DrynessYes  Climax: WNL Marinoff Scale: 0/3 Lubricant: no  PREGNANCY: Vaginal deliveries 3 Tearing Yes: with first, sutures  Episiotomy No C-section deliveries 0 Currently pregnant No  PROLAPSE: None   OBJECTIVE:  Note: Objective measures were completed at Evaluation unless otherwise noted.  07/11/23:  PATIENT SURVEYS:  PFIQ-7: 21  COGNITION: Overall cognitive status: Within functional limits for tasks assessed     SENSATION: Light touch: Appears intact   FUNCTIONAL TESTS:  Squat: bil valgus knee collapse Single leg stance:  Rt: pelvic drop  JX:BJYNWG drop Curl-up test:2 finger width diastasis with upper abdominal distortion   GAIT: Assistive device utilized: None Comments: WNL  POSTURE: rounded shoulders, increased lumbar lordosis, increased  thoracic kyphosis, and anterior pelvic tilt   LUMBARAROM/PROM:  A/PROM A/PROM  Eval (% available)  Flexion 60  Extension 25, pain  Right lateral flexion 50  Left lateral flexion 50  Right rotation 50  Left rotation 50   (Blank rows = not tested)   PALPATION:   General: tension and tenderness in low back and bil hips  Pelvic Alignment: Rt posterior rotation  Abdominal: no tenderness                External Perineal Exam: some labial fusion                             Internal Pelvic Floor: some pelvic floor muscle atrophy  Patient confirms identification and approves PT to assess internal pelvic floor and treatment Yes  PELVIC MMT:   MMT eval  Vaginal 2/5, 2 second endurance, 7 repeat contractions  Diastasis Recti 2 finger widths   (Blank rows = not tested)        TONE: WNL  PROLAPSE: Grade 2 anterior vaginal wall laxity  TODAY'S TREATMENT:                                                                                                                              DATE:  07/25/23 Neuromuscular re-education: Bridge with hip adduction, transversus abdominus, and pelvic floor muscle 2 x 10 Supine march 2 x 10 Supine hip abduction 2 x 10 Supine resisted march 10 x bil Clam shells in side lying 2 x 10 bil Seated hip adduction ball press with transversus abdominus and pelvic floor muscle 2 x 10 Seated hip abduction red band with  transversus abdominus and pelvic floor muscle 2 x 10 Seated resisted march red band with transversus abdominus and pelvic floor muscle 2 x 10 Exercises: Lower trunk rotation 3 x 10 Open books 10x bil Seated piriformis stretch 60 sec bil Seated hamstring stretch 60 sec bil  07/18/23 Neuromuscular re-education: Transversus abdominus training with multimodal cues for improved motor control and breath coordination Transversus abdominus isometrics 10x  Bil supine UE ball press with transversus abdominus and pelvic floor muscle contractions and  breath coordination 10x Supine hip adduction ball press with transversus abdominus and pelvic floor muscle contractions and breath coordination 10x Bridge with hip adduction, transversus abdominus, and pelvic floor muscle 2 x 5 Seated hip adduction ball press with transversus abdominus and pelvic floor muscle 2 x 10 Seated hip abduction red band with transversus abdominus and pelvic floor muscle 2 x 10 Seated resisted march red band with transversus abdominus and pelvic floor muscle 2 x 10 Exercises: Lower trunk rotation 2 x 10 Single knee to chest 10x bil Open books 10x bil Seated piriformis stretch  07/11/23  EVAL  Neuromuscular re-education: Pt provides verbal consent for internal vaginal/rectal pelvic floor exam. Internal vaginal pelvic floor muscle contraction training Quick flicks Long holds Urge drill The knack Therapeutic activities: Double voiding lubricants   PATIENT EDUCATION:  Education details: See above Person educated: Patient Education method: Explanation, Demonstration, Tactile cues, Verbal cues, and Handouts Education comprehension: verbalized understanding  HOME EXERCISE PROGRAM: [id number]  ASSESSMENT:  CLINICAL IMPRESSION: Pt is doing very well with not having any leakage with coughing/sneezing. Her back continues to become painful with prolonged standing when at work on concrete floors. We discussed that this will hopefully continue to improve as she builds more core strength and continues with mobility activities. She did very well with all exercise progressions today. She did very well with all exercise progressions and is doing well with She will continue to benefit from skilled PT intervention in order to improve bladder control, decrease low back pain, improve abdominal pressure management, and begin/progress functional strengthening program.   OBJECTIVE IMPAIRMENTS: Abnormal gait, decreased activity tolerance, decreased coordination, decreased  knowledge of condition, decreased mobility, decreased strength, increased fascial restrictions, increased muscle spasms, impaired tone, postural dysfunction, and pain.   ACTIVITY LIMITATIONS: carrying, lifting, bending, standing, continence, and locomotion level  PARTICIPATION LIMITATIONS: community activity and occupation  PERSONAL FACTORS: 1 comorbidity: medical history  are also affecting patient's functional outcome.   REHAB POTENTIAL: Good  CLINICAL DECISION MAKING: Stable/uncomplicated  EVALUATION COMPLEXITY: Low   GOALS: Goals reviewed with patient? Yes  SHORT TERM GOALS: Target date: 08/08/2023   Pt will be independent with advanced HEP.   Baseline: Goal status: INITIAL  2.  Pt will report 25% improvement in urinary incontinence.  Baseline:  Goal status: INITIAL  3.  Pt will be independent with the knack, urge suppression technique, and double voiding in order to improve bladder habits and decrease urinary incontinence.   Baseline:  Goal status: INITIAL  4.  Pt will be able to correctly perform diaphragmatic breathing and appropriate pressure management in order to prevent worsening vaginal wall laxity and improve pelvic floor A/ROM.   Baseline:  Goal status: INITIAL  5.  Pt will report 25% improvement in low back pain with standing and walking at work.  Baseline:  Goal status: INITIAL    LONG TERM GOALS: Target date: 12/26/23  Pt will be independent with advanced HEP.   Baseline:  Goal status: INITIAL  2.  Pt will  report 75% improvement in urinary incontinence.  Baseline:  Goal status: INITIAL  3.  Pt will report no leaks with laughing, coughing, sneezing in order to improve comfort with interpersonal relationships and community activities.   Baseline:  Goal status: INITIAL  4.  Pt will be able to go 2-3 hours in between voids without urgency or incontinence in order to improve QOL and perform all functional activities with less difficulty.    Baseline:  Goal status: INITIAL  5.  Pt will report 75% improvement in low back pain with standing and walking at work.  Baseline:  Goal status: INITIAL  6.  Pt will demonstrate 20 point improvement in PFIQ-7 score in order to show functional improvement in urinary incontinence.  Baseline: current score 57 Goal status: INITIAL  PLAN:  PT FREQUENCY: 1-2x/week  PT DURATION: 6 months  PLANNED INTERVENTIONS: 97110-Therapeutic exercises, 97530- Therapeutic activity, 97112- Neuromuscular re-education, 97535- Self Care, 13086- Manual therapy, Dry Needling, and Biofeedback  PLAN FOR NEXT SESSION: Progress core training.    Julio Alm, PT, DPT04/12/2508:08 AM

## 2023-08-01 ENCOUNTER — Ambulatory Visit: Payer: BC Managed Care – PPO

## 2023-08-01 DIAGNOSIS — M6281 Muscle weakness (generalized): Secondary | ICD-10-CM | POA: Diagnosis not present

## 2023-08-01 DIAGNOSIS — M62838 Other muscle spasm: Secondary | ICD-10-CM | POA: Diagnosis not present

## 2023-08-01 DIAGNOSIS — M5459 Other low back pain: Secondary | ICD-10-CM | POA: Diagnosis not present

## 2023-08-01 DIAGNOSIS — R279 Unspecified lack of coordination: Secondary | ICD-10-CM | POA: Diagnosis not present

## 2023-08-01 DIAGNOSIS — R293 Abnormal posture: Secondary | ICD-10-CM | POA: Diagnosis not present

## 2023-08-01 NOTE — Therapy (Signed)
 OUTPATIENT PHYSICAL THERAPY FEMALE PELVIC TREATMENT   Patient Name: Misty Bates MRN: [id number] DOB:[birth year], 53 y.o., female Today's Date: 08/01/2023  END OF SESSION:  PT End of Session - 08/01/23 0926     Visit Number 4    Date for PT Re-Evaluation 12/26/23    Authorization Type BCBS    PT Start Time 0930    PT Stop Time 1010    PT Time Calculation (min) 40 min    Activity Tolerance Patient tolerated treatment well    Behavior During Therapy [facility] for tasks assessed/performed               Past Medical History:  Diagnosis Date   Cervical dysplasia 2007   laser   Urinary incontinence    Stress incontinence   Past Surgical History:  Procedure Laterality Date   COLPOSCOPY     FOOT SURGERY     LASER ABLATION OF THE CERVIX     OPEN REDUCTION INTERNAL FIXATION (ORIF) DISTAL RADIAL FRACTURE Right 05/18/2016   Procedure: OPEN REDUCTION INTERNAL FIXATION RIGHT FOREARM BOTH BONE FRACTURE, OPEN REDUCTION INTERNAL FIXATION RIGHT INDEX FINGER;  Surgeon: Mack Hook, MD;  Location: [facility] OR;  Service: Orthopedics;  Laterality: Right;   TUBAL LIGATION     Patient Active Problem List   Diagnosis Date Noted   Transaminitis 05/20/2017    PCP: Azucena Fallen, PA  REFERRING PROVIDER: Sherian Rein, MD   REFERRING DIAG: N39.3 (ICD-10-CM) - Stress incontinence (female) (female)  THERAPY DIAG:  Muscle weakness (generalized)  Other muscle spasm  Unspecified lack of coordination  Abnormal posture  Other low back pain  Rationale for Evaluation and Treatment: Rehabilitation  ONSET DATE: 16 years   SUBJECTIVE:                                                                                                                                                                                           SUBJECTIVE STATEMENT: Pt states that low back is starting to feel better at work. She has been propping foot up when having to just stand which has been helpful.    PAIN: 08/01/23 Are you having pain? Yes NPRS scale: 0/10 Pain location:  low back pain  Pain type: aching and tight Pain description: intermittent   Aggravating factors: walking, standing on hard floor  Relieving factors: sitting, rest  PRECAUTIONS: None  RED FLAGS: None   WEIGHT BEARING RESTRICTIONS: No  FALLS:  Has patient fallen in last 6 months? No  OCCUPATION: [employer]   ACTIVITY LEVEL : standing at work all day  PLOF: Independent  PATIENT GOALS: better bladder control  PERTINENT HISTORY:  3 vaginal deliveries, tubal ligation  BOWEL MOVEMENT: Pain with bowel movement: No Type of bowel movement:Frequency 1x/day and Strain no Fully empty rectum:  Leakage: No Pads: No Fiber supplement/laxative Yes   URINATION: Pain with urination: No Fully empty bladder: Yes: but post void dribbling Stream: Strong Urgency: Yes  Frequency: at least every 2 hours Leakage: Coughing, Sneezing, and Laughing Pads: No, not unless she gets sick  INTERCOURSE:  Ability to have vaginal penetration Yes  Pain with intercourse: none DrynessYes  Climax: WNL Marinoff Scale: 0/3 Lubricant: no  PREGNANCY: Vaginal deliveries 3 Tearing Yes: with first, sutures  Episiotomy No C-section deliveries 0 Currently pregnant No  PROLAPSE: None   OBJECTIVE:  Note: Objective measures were completed at Evaluation unless otherwise noted.  07/11/23:  PATIENT SURVEYS:  PFIQ-7: 44  COGNITION: Overall cognitive status: Within functional limits for tasks assessed     SENSATION: Light touch: Appears intact   FUNCTIONAL TESTS:  Squat: bil valgus knee collapse Single leg stance:  Rt: pelvic drop  XB:JYNWGN drop Curl-up test:2 finger width diastasis with upper abdominal distortion   GAIT: Assistive device utilized: None Comments: WNL  POSTURE: rounded shoulders, increased lumbar lordosis, increased thoracic kyphosis, and anterior pelvic  tilt   LUMBARAROM/PROM:  A/PROM A/PROM  Eval (% available)  Flexion 60  Extension 25, pain  Right lateral flexion 50  Left lateral flexion 50  Right rotation 50  Left rotation 50   (Blank rows = not tested)   PALPATION:   General: tension and tenderness in low back and bil hips  Pelvic Alignment: Rt posterior rotation  Abdominal: no tenderness                External Perineal Exam: some labial fusion                             Internal Pelvic Floor: some pelvic floor muscle atrophy  Patient confirms identification and approves PT to assess internal pelvic floor and treatment Yes  PELVIC MMT:   MMT eval  Vaginal 2/5, 2 second endurance, 7 repeat contractions  Diastasis Recti 2 finger widths   (Blank rows = not tested)        TONE: WNL  PROLAPSE: Grade 2 anterior vaginal wall laxity  TODAY'S TREATMENT:                                                                                                                              DATE:  08/01/23 Neuromuscular re-education: Bridge with hip adduction, transversus abdominus, and pelvic floor muscle 2 x 10 Supine leg extensions 10x bil Seated rotation crunch with 5lbs 20x Seated resisted rotation red band 2 x 10 bil Seated march with 5lb anterior weight hold 2 x 20 Exercises: Lower trunk rotation 2 x 10 Seated piriformis stretch 60 sec bil Seated hamstring stretch 60 sec bil Therapeutic activities: Squats 2 x 10 to  table  3 way kick 10x each, bil   07/25/23 Neuromuscular re-education: Bridge with hip adduction, transversus abdominus, and pelvic floor muscle 2 x 10 Supine march 2 x 10 Supine hip abduction 2 x 10 Supine resisted march 10 x bil Clam shells in side lying 2 x 10 bil Seated hip adduction ball press with transversus abdominus and pelvic floor muscle 2 x 10 Seated hip abduction red band with transversus abdominus and pelvic floor muscle 2 x 10 Seated resisted march red band with transversus abdominus  and pelvic floor muscle 2 x 10 Exercises: Lower trunk rotation 3 x 10 Open books 10x bil Seated piriformis stretch 60 sec bil Seated hamstring stretch 60 sec bil  07/18/23 Neuromuscular re-education: Transversus abdominus training with multimodal cues for improved motor control and breath coordination Transversus abdominus isometrics 10x  Bil supine UE ball press with transversus abdominus and pelvic floor muscle contractions and breath coordination 10x Supine hip adduction ball press with transversus abdominus and pelvic floor muscle contractions and breath coordination 10x Bridge with hip adduction, transversus abdominus, and pelvic floor muscle 2 x 5 Seated hip adduction ball press with transversus abdominus and pelvic floor muscle 2 x 10 Seated hip abduction red band with transversus abdominus and pelvic floor muscle 2 x 10 Seated resisted march red band with transversus abdominus and pelvic floor muscle 2 x 10 Exercises: Lower trunk rotation 2 x 10 Single knee to chest 10x bil Open books 10x bil Seated piriformis stretch   PATIENT EDUCATION:  Education details: See above Person educated: Patient Education method: Programmer, multimedia, Demonstration, Tactile cues, Verbal cues, and Handouts Education comprehension: verbalized understanding  HOME EXERCISE PROGRAM: [id number]  ASSESSMENT:  CLINICAL IMPRESSION: Pt is doing very well with improved bladder control and decreased low back pain. She did very well with exercise review and progressions today. She was able to progress many seated and standing exercises. She did very well with all exercise progressions and is able to feel core activation. She will continue to benefit from skilled PT intervention in order to improve bladder control, decrease low back pain, improve abdominal pressure management, and begin/progress functional strengthening program.   OBJECTIVE IMPAIRMENTS: Abnormal gait, decreased activity tolerance, decreased  coordination, decreased knowledge of condition, decreased mobility, decreased strength, increased fascial restrictions, increased muscle spasms, impaired tone, postural dysfunction, and pain.   ACTIVITY LIMITATIONS: carrying, lifting, bending, standing, continence, and locomotion level  PARTICIPATION LIMITATIONS: community activity and occupation  PERSONAL FACTORS: 1 comorbidity: medical history  are also affecting patient's functional outcome.   REHAB POTENTIAL: Good  CLINICAL DECISION MAKING: Stable/uncomplicated  EVALUATION COMPLEXITY: Low   GOALS: Goals reviewed with patient? Yes  SHORT TERM GOALS: Target date: 08/08/2023   Pt will be independent with advanced HEP.   Baseline: Goal status: INITIAL  2.  Pt will report 25% improvement in urinary incontinence.  Baseline:  Goal status: INITIAL  3.  Pt will be independent with the knack, urge suppression technique, and double voiding in order to improve bladder habits and decrease urinary incontinence.   Baseline:  Goal status: INITIAL  4.  Pt will be able to correctly perform diaphragmatic breathing and appropriate pressure management in order to prevent worsening vaginal wall laxity and improve pelvic floor A/ROM.   Baseline:  Goal status: INITIAL  5.  Pt will report 25% improvement in low back pain with standing and walking at work.  Baseline:  Goal status: INITIAL    LONG TERM GOALS: Target date: 12/26/23  Pt will be  independent with advanced HEP.   Baseline:  Goal status: INITIAL  2.  Pt will report 75% improvement in urinary incontinence.  Baseline:  Goal status: INITIAL  3.  Pt will report no leaks with laughing, coughing, sneezing in order to improve comfort with interpersonal relationships and community activities.   Baseline:  Goal status: INITIAL  4.  Pt will be able to go 2-3 hours in between voids without urgency or incontinence in order to improve QOL and perform all functional activities with  less difficulty.   Baseline:  Goal status: INITIAL  5.  Pt will report 75% improvement in low back pain with standing and walking at work.  Baseline:  Goal status: INITIAL  6.  Pt will demonstrate 20 point improvement in PFIQ-7 score in order to show functional improvement in urinary incontinence.  Baseline: current score 57 Goal status: INITIAL  PLAN:  PT FREQUENCY: 1-2x/week  PT DURATION: 6 months  PLANNED INTERVENTIONS: 97110-Therapeutic exercises, 97530- Therapeutic activity, 97112- Neuromuscular re-education, 97535- Self Care, 16109- Manual therapy, Dry Needling, and Biofeedback  PLAN FOR NEXT SESSION: Progress core training.    Verlena Glenn, PT, DPT04/16/2510:10 AM

## 2023-08-09 ENCOUNTER — Ambulatory Visit: Admitting: Physical Therapy

## 2023-08-09 ENCOUNTER — Telehealth: Payer: Self-pay | Admitting: Physical Therapy

## 2023-08-09 NOTE — Telephone Encounter (Signed)
 Left pt  a message re no show for PT appt. Pt has several appts scheduled, asked her to call us  back if she wants to keep them.

## 2023-08-16 ENCOUNTER — Ambulatory Visit: Attending: Obstetrics and Gynecology | Admitting: Physical Therapy

## 2023-08-16 ENCOUNTER — Telehealth: Payer: Self-pay | Admitting: Physical Therapy

## 2023-08-16 DIAGNOSIS — R293 Abnormal posture: Secondary | ICD-10-CM | POA: Insufficient documentation

## 2023-08-16 DIAGNOSIS — M6281 Muscle weakness (generalized): Secondary | ICD-10-CM | POA: Insufficient documentation

## 2023-08-16 DIAGNOSIS — R279 Unspecified lack of coordination: Secondary | ICD-10-CM | POA: Insufficient documentation

## 2023-08-16 DIAGNOSIS — M5459 Other low back pain: Secondary | ICD-10-CM | POA: Insufficient documentation

## 2023-08-16 DIAGNOSIS — M62838 Other muscle spasm: Secondary | ICD-10-CM | POA: Insufficient documentation

## 2023-08-16 NOTE — Telephone Encounter (Signed)
 Left pt a message regarding second no show, told her that per dept policy I will have to cancel her 3 scheduled visits and she will need to get a new referral. She can call us  at [phone]

## 2023-08-23 ENCOUNTER — Encounter: Admitting: Physical Therapy

## 2023-08-30 ENCOUNTER — Encounter

## 2023-09-06 ENCOUNTER — Encounter: Admitting: Physical Therapy

## 2024-02-04 DIAGNOSIS — Z23 Encounter for immunization: Secondary | ICD-10-CM | POA: Diagnosis not present

## 2024-02-04 DIAGNOSIS — N951 Menopausal and female climacteric states: Secondary | ICD-10-CM | POA: Diagnosis not present

## 2024-02-04 DIAGNOSIS — Z0000 Encounter for general adult medical examination without abnormal findings: Secondary | ICD-10-CM | POA: Diagnosis not present

## 2024-02-04 DIAGNOSIS — B351 Tinea unguium: Secondary | ICD-10-CM | POA: Diagnosis not present

## 2024-03-05 DIAGNOSIS — Z8632 Personal history of gestational diabetes: Secondary | ICD-10-CM | POA: Diagnosis not present

## 2024-03-05 DIAGNOSIS — N951 Menopausal and female climacteric states: Secondary | ICD-10-CM | POA: Diagnosis not present

## 2024-03-05 DIAGNOSIS — Z0000 Encounter for general adult medical examination without abnormal findings: Secondary | ICD-10-CM | POA: Diagnosis not present

## 2024-03-12 DIAGNOSIS — R748 Abnormal levels of other serum enzymes: Secondary | ICD-10-CM | POA: Diagnosis not present

## 2024-03-12 DIAGNOSIS — E782 Mixed hyperlipidemia: Secondary | ICD-10-CM | POA: Diagnosis not present

## 2024-03-12 DIAGNOSIS — Z0000 Encounter for general adult medical examination without abnormal findings: Secondary | ICD-10-CM | POA: Diagnosis not present

## 2024-03-12 DIAGNOSIS — R7303 Prediabetes: Secondary | ICD-10-CM | POA: Diagnosis not present

## 2024-04-09 DIAGNOSIS — Z124 Encounter for screening for malignant neoplasm of cervix: Secondary | ICD-10-CM | POA: Diagnosis not present

## 2024-04-09 DIAGNOSIS — Z1151 Encounter for screening for human papillomavirus (HPV): Secondary | ICD-10-CM | POA: Diagnosis not present

## 2024-04-09 DIAGNOSIS — Z113 Encounter for screening for infections with a predominantly sexual mode of transmission: Secondary | ICD-10-CM | POA: Diagnosis not present

## 2024-04-09 DIAGNOSIS — Z1231 Encounter for screening mammogram for malignant neoplasm of breast: Secondary | ICD-10-CM | POA: Diagnosis not present

## 2024-04-09 DIAGNOSIS — Z130 Encounter for screening for diseases of the blood and blood-forming organs and certain disorders involving the immune mechanism: Secondary | ICD-10-CM | POA: Diagnosis not present

## 2024-04-09 DIAGNOSIS — Z01419 Encounter for gynecological examination (general) (routine) without abnormal findings: Secondary | ICD-10-CM | POA: Diagnosis not present

## 2024-04-09 DIAGNOSIS — R300 Dysuria: Secondary | ICD-10-CM | POA: Diagnosis not present
